# Patient Record
Sex: Female | Born: 1970 | Race: White | Hispanic: No | Marital: Married | Smoking: Never smoker
Health system: Southern US, Community
[De-identification: ages and names within clinical notes are randomized; demographics above are authoritative.]

## PROBLEM LIST (undated history)

## (undated) DIAGNOSIS — M1990 Unspecified osteoarthritis, unspecified site: Secondary | ICD-10-CM

## (undated) DIAGNOSIS — J349 Unspecified disorder of nose and nasal sinuses: Secondary | ICD-10-CM

## (undated) DIAGNOSIS — K219 Gastro-esophageal reflux disease without esophagitis: Secondary | ICD-10-CM

## (undated) DIAGNOSIS — J45909 Unspecified asthma, uncomplicated: Secondary | ICD-10-CM

## (undated) HISTORY — DX: Unspecified disorder of nose and nasal sinuses: J34.9

## (undated) HISTORY — PX: TENDON REPAIR: SHX5111

## (undated) HISTORY — PX: ABDOMINAL HYSTERECTOMY: SHX81

---

## 2009-09-28 ENCOUNTER — Ambulatory Visit: Payer: Self-pay | Admitting: Cardiology

## 2010-11-04 ENCOUNTER — Other Ambulatory Visit: Payer: Self-pay | Admitting: Gastroenterology

## 2010-11-04 DIAGNOSIS — R7989 Other specified abnormal findings of blood chemistry: Secondary | ICD-10-CM

## 2010-11-11 ENCOUNTER — Other Ambulatory Visit: Payer: Self-pay

## 2010-11-17 ENCOUNTER — Ambulatory Visit
Admission: RE | Admit: 2010-11-17 | Discharge: 2010-11-17 | Disposition: A | Discharge status: Home / Self Care | Payer: PRIVATE HEALTH INSURANCE | Source: Ambulatory Visit | Attending: Gastroenterology | Admitting: Gastroenterology

## 2010-11-17 DIAGNOSIS — R7989 Other specified abnormal findings of blood chemistry: Secondary | ICD-10-CM

## 2012-01-21 HISTORY — PX: ABDOMINAL HYSTERECTOMY: SHX81

## 2013-02-21 ENCOUNTER — Ambulatory Visit: Payer: PRIVATE HEALTH INSURANCE | Admitting: Gastroenterology

## 2013-04-15 ENCOUNTER — Encounter: Payer: Self-pay | Admitting: *Deleted

## 2013-04-18 ENCOUNTER — Telehealth: Payer: Self-pay | Admitting: Gastroenterology

## 2013-04-18 ENCOUNTER — Ambulatory Visit: Payer: PRIVATE HEALTH INSURANCE | Admitting: Gastroenterology

## 2013-04-18 NOTE — Telephone Encounter (Signed)
Pt was a no show

## 2015-12-28 ENCOUNTER — Emergency Department (HOSPITAL_COMMUNITY)
Admission: EM | Admit: 2015-12-28 | Discharge: 2015-12-28 | Disposition: A | Discharge status: Home / Self Care | Payer: 59 | Attending: Emergency Medicine | Admitting: Emergency Medicine

## 2015-12-28 ENCOUNTER — Encounter (HOSPITAL_COMMUNITY): Payer: Self-pay | Admitting: Emergency Medicine

## 2015-12-28 ENCOUNTER — Emergency Department (HOSPITAL_COMMUNITY): Payer: 59

## 2015-12-28 DIAGNOSIS — Z791 Long term (current) use of non-steroidal anti-inflammatories (NSAID): Secondary | ICD-10-CM | POA: Diagnosis not present

## 2015-12-28 DIAGNOSIS — R1013 Epigastric pain: Secondary | ICD-10-CM | POA: Diagnosis not present

## 2015-12-28 DIAGNOSIS — Z79899 Other long term (current) drug therapy: Secondary | ICD-10-CM | POA: Diagnosis not present

## 2015-12-28 DIAGNOSIS — M1990 Unspecified osteoarthritis, unspecified site: Secondary | ICD-10-CM | POA: Insufficient documentation

## 2015-12-28 DIAGNOSIS — K5900 Constipation, unspecified: Secondary | ICD-10-CM | POA: Diagnosis not present

## 2015-12-28 DIAGNOSIS — R079 Chest pain, unspecified: Secondary | ICD-10-CM | POA: Insufficient documentation

## 2015-12-28 DIAGNOSIS — R42 Dizziness and giddiness: Secondary | ICD-10-CM | POA: Insufficient documentation

## 2015-12-28 DIAGNOSIS — R0789 Other chest pain: Secondary | ICD-10-CM

## 2015-12-28 DIAGNOSIS — R0602 Shortness of breath: Secondary | ICD-10-CM | POA: Insufficient documentation

## 2015-12-28 DIAGNOSIS — J45909 Unspecified asthma, uncomplicated: Secondary | ICD-10-CM | POA: Insufficient documentation

## 2015-12-28 DIAGNOSIS — Z7982 Long term (current) use of aspirin: Secondary | ICD-10-CM | POA: Diagnosis not present

## 2015-12-28 HISTORY — DX: Unspecified asthma, uncomplicated: J45.909

## 2015-12-28 HISTORY — DX: Unspecified osteoarthritis, unspecified site: M19.90

## 2015-12-28 HISTORY — DX: Gastro-esophageal reflux disease without esophagitis: K21.9

## 2015-12-28 LAB — CBC
HCT: 39.9 % (ref 36.0–46.0)
Hemoglobin: 13.4 g/dL (ref 12.0–15.0)
MCH: 29.5 pg (ref 26.0–34.0)
MCHC: 33.6 g/dL (ref 30.0–36.0)
MCV: 87.9 fL (ref 78.0–100.0)
Platelets: 341 10*3/uL (ref 150–400)
RBC: 4.54 MIL/uL (ref 3.87–5.11)
RDW: 13.1 % (ref 11.5–15.5)
WBC: 7 10*3/uL (ref 4.0–10.5)

## 2015-12-28 LAB — DIFFERENTIAL
Basophils Absolute: 0 10*3/uL (ref 0.0–0.1)
Basophils Relative: 0 %
EOS ABS: 0.1 10*3/uL (ref 0.0–0.7)
EOS PCT: 1 %
LYMPHS ABS: 1.6 10*3/uL (ref 0.7–4.0)
Lymphocytes Relative: 22 %
MONOS PCT: 8 %
Monocytes Absolute: 0.5 10*3/uL (ref 0.1–1.0)
NEUTROS PCT: 69 %
Neutro Abs: 4.9 10*3/uL (ref 1.7–7.7)

## 2015-12-28 LAB — URINALYSIS, ROUTINE W REFLEX MICROSCOPIC
BILIRUBIN URINE: NEGATIVE
Glucose, UA: NEGATIVE mg/dL
Hgb urine dipstick: NEGATIVE
KETONES UR: NEGATIVE mg/dL
Leukocytes, UA: NEGATIVE
Nitrite: NEGATIVE
PH: 6 (ref 5.0–8.0)
Protein, ur: NEGATIVE mg/dL
Specific Gravity, Urine: 1.015 (ref 1.005–1.030)

## 2015-12-28 LAB — COMPREHENSIVE METABOLIC PANEL
ALBUMIN: 4 g/dL (ref 3.5–5.0)
ALK PHOS: 92 U/L (ref 38–126)
ALT: 29 U/L (ref 14–54)
AST: 19 U/L (ref 15–41)
Anion gap: 7 (ref 5–15)
BILIRUBIN TOTAL: 0.6 mg/dL (ref 0.3–1.2)
BUN: 16 mg/dL (ref 6–20)
CALCIUM: 9.1 mg/dL (ref 8.9–10.3)
CO2: 27 mmol/L (ref 22–32)
Chloride: 105 mmol/L (ref 101–111)
Creatinine, Ser: 0.62 mg/dL (ref 0.44–1.00)
GFR calc Af Amer: >60 mL/min (ref >60)
GFR calc non Af Amer: >60 mL/min (ref >60)
GLUCOSE: 111 mg/dL — AB (ref 65–99)
Potassium: 3.9 mmol/L (ref 3.5–5.1)
SODIUM: 139 mmol/L (ref 135–145)
TOTAL PROTEIN: 7 g/dL (ref 6.5–8.1)

## 2015-12-28 LAB — LIPASE, BLOOD: Lipase: 38 U/L (ref 11–51)

## 2015-12-28 LAB — BRAIN NATRIURETIC PEPTIDE: B Natriuretic Peptide: 17 pg/mL (ref 0.0–100.0)

## 2015-12-28 LAB — TROPONIN I: Troponin I: <0.03 ng/mL (ref <0.031)

## 2015-12-28 MED ORDER — POLYETHYLENE GLYCOL 3350 17 GM/SCOOP PO POWD: 17.0000 g | Freq: Once | ORAL | Status: DC

## 2015-12-28 NOTE — Discharge Instructions (Signed)
Your liver function testing is normal and your blood work and urine look normal. Your chest ray is clear but your abdominal x-ray suggests constipation. We recommend taking miralax. Your heart testing is normal as well, and your symptoms does not seem consistent with that of a heart attack  Return without fail if your symptoms are worsening. That includes worsening pain, fevers, vomiting unable to keep down food or fluids, chest pain that comes on with activity, or any other symptoms concerning to you. Please follow up closely with your primary care doctor to discuss need for further testing.   Abdominal Pain, Adult Many things can cause abdominal pain. Usually, abdominal pain is not caused by a disease and will improve without treatment. It can often be observed and treated at home. Your health care provider will do a physical exam and possibly order blood tests and X-rays to help determine the seriousness of your pain. However, in many cases, more time must pass before a clear cause of the pain can be found. Before that point, your health care provider may not know if you need more testing or further treatment. HOME CARE INSTRUCTIONS Monitor your abdominal pain for any changes. The following actions may help to alleviate any discomfort you are experiencing:  Only take over-the-counter or prescription medicines as directed by your health care provider.  Do not take laxatives unless directed to do so by your health care provider.  Try a clear liquid diet (broth, tea, or water) as directed by your health care provider. Slowly move to a bland diet as tolerated. SEEK MEDICAL CARE IF:  You have unexplained abdominal pain.  You have abdominal pain associated with nausea or diarrhea.  You have pain when you urinate or have a bowel movement.  You experience abdominal pain that wakes you in the night.  You have abdominal pain that is worsened or improved by eating food.  You have abdominal pain  that is worsened with eating fatty foods.  You have a fever. SEEK IMMEDIATE MEDICAL CARE IF:  Your pain does not go away within 2 hours.  You keep throwing up (vomiting).  Your pain is felt only in portions of the abdomen, such as the right side or the left lower portion of the abdomen.  You pass bloody or black tarry stools. MAKE SURE YOU:  Understand these instructions.  Will watch your condition.  Will get help right away if you are not doing well or get worse.   This information is not intended to replace advice given to you by your health care provider. Make sure you discuss any questions you have with your health care provider.   Document Released: 05/18/2005 Document Revised: 04/29/2015 Document Reviewed: 04/17/2013 Elsevier Interactive Patient Education [employer]2016 Elsevier Inc.

## 2015-12-28 NOTE — ED Notes (Signed)
Patient given discharge instruction, verbalized understand. Patient ambulatory out of the department.  

## 2015-12-28 NOTE — ED Notes (Signed)
PT c/o middle abdominal pain with tan colored stools with last BM today. PT also states pain goes into her back and aching down left arm as well. PT denies any SOB nausea, vomiting or diarrhea. PT denies any urinary symptoms.

## 2015-12-28 NOTE — ED Notes (Signed)
Pt voices numerous complaints including chest pain a few days ago. Pt states she wants an EKG because the chest pain scared her

## 2015-12-28 NOTE — ED Provider Notes (Signed)
CSN: [id number]     Arrival date & time 12/28/15  1013 History  By signing my name below, I, Ruth Colon, attest that this documentation has been prepared under the direction and in the presence of [name], MD Electronically Signed: [name], ED Scribe 12/28/2015 at 12:53 PM.   Chief Complaint  Patient presents with  . Abdominal Pain   The history is provided by the patient. No language interpreter was used.   HPI Comments: Ruth Colon is a 45 y.o. female, with a h/o GERD, who presents to the Emergency Department complaining of gradually worsening, 4/10 generalized abdominal pain, intermittent, for the past 4 days. Pt currently reports constant low abdominal pain that radiates into her low back and improves after BMs. She has noticed tan colored stools for the past few days. Pt reports associated symptoms of dull, burning left arm pain onset 3 nights ago, dizziness, nausea, constipation, non-exertional chest pain x 4 days. Has been feeling some sob with activity, but this is typical for that of her asthma. She denies dietary changes. Pt also denies fever, chills, night sweats, appetite change, difficulty urinating, urinary malodor or discoloration, vomiting, neck pain, new leg pain or leg swelling. Pshx includes hysterectomy without oophorectomy.   Past Medical History  Diagnosis Date  . GERD (gastroesophageal reflux disease)   . Arthritis   . Asthma    Past Surgical History  Procedure Laterality Date  . Abdominal hysterectomy    . Tendon repair     History reviewed. No pertinent family history. Social History  Substance Use Topics  . Smoking status: Never Smoker   . Smokeless tobacco: None  . Alcohol Use: No   OB History    Gravida Para Term Preterm AB TAB SAB Ectopic Multiple Living   3              Review of Systems  Constitutional: Negative for fever, chills, diaphoresis and appetite change.  Respiratory: Positive for shortness of breath.   Cardiovascular:  Positive for chest pain. Negative for leg swelling.  Gastrointestinal: Positive for nausea, abdominal pain and constipation. Negative for vomiting.  Genitourinary: Negative for difficulty urinating.  Musculoskeletal: Negative for neck pain.  Neurological: Positive for dizziness.  All other systems reviewed and are negative.  Allergies  Macrobid  Home Medications   Prior to Admission medications   Medication Sig Start Date End Date Taking? Authorizing Provider  aspirin EC 81 MG tablet Take 81 mg by mouth daily.   Yes Historical Provider, MD  beclomethasone (QVAR) 40 MCG/ACT inhaler Inhale 1 puff into the lungs daily.   Yes Historical Provider, MD  diclofenac (VOLTAREN) 50 MG EC tablet Take 50 mg by mouth 2 (two) times daily as needed for mild pain.   Yes Historical Provider, MD  ibuprofen (ADVIL,MOTRIN) 800 MG tablet Take 800 mg by mouth every 8 (eight) hours as needed for moderate pain.   Yes Historical Provider, MD  pantoprazole (PROTONIX) 40 MG tablet Take 40 mg by mouth daily as needed (acid reflux).   Yes Historical Provider, MD  PROAIR HFA 108 [phone] Base) MCG/ACT inhaler Inhale 2 puffs into the lungs every 6 (six) hours as needed for wheezing or shortness of breath.  12/27/15  Yes Historical Provider, MD  polyethylene glycol powder (GLYCOLAX/MIRALAX) powder Take 17 g by mouth once. Dissolve powder into liquid and take once daily. 12/28/15   [name], MD   BP 129/77 mmHg  Pulse 70  Temp(Src) 98.1 F (36.7 C) (  Oral)  Resp 18  Ht 5\' 3"  (1.6 m)  Wt 211 lb (95.709 kg)  BMI 37.39 kg/m2  SpO2 100% Physical Exam Physical Exam  Nursing note and vitals reviewed. Constitutional: Well developed, well nourished, non-toxic, and in no acute distress Head: Normocephalic and atraumatic.  Mouth/Throat: Oropharynx is clear and moist.  Neck: Normal range of motion. Neck supple.  Cardiovascular: Normal rate and regular rhythm.   Pulmonary/Chest: Effort normal and breath sounds normal.   Abdominal: Soft. There is mild epigastric tenderness. There is no rebound and no guarding.  Musculoskeletal: Normal range of motion.  Neurological: Alert, no facial droop, fluent speech, moves all extremities symmetrically Skin: Skin is warm and dry.  Psychiatric: Cooperative  ED Course  Procedures (including critical care time) DIAGNOSTIC STUDIES: Oxygen Saturation is 100% on RA, normal by my interpretation.    COORDINATION OF CARE: 11:47 AM-Discussed treatment plan which includes XR, EKG, CBC, CMP, UA, I-stat, with pt at bedside and pt agreed to plan.   Labs Review Labs Reviewed  COMPREHENSIVE METABOLIC PANEL - Abnormal; Notable for the following:    Glucose, Bld 111 (*)    All other components within normal limits  LIPASE, BLOOD  CBC  URINALYSIS, ROUTINE W REFLEX MICROSCOPIC (NOT AT [facility])  BRAIN NATRIURETIC PEPTIDE  DIFFERENTIAL  TROPONIN I   Imaging Review Dg Abd Acute W/chest  12/28/2015  CLINICAL DATA:  Abdominal and chest pain EXAM: DG ABDOMEN ACUTE W/ 1V CHEST COMPARISON:  CT abdomen and pelvis February 16, 2015; chest radiograph May 06, 2008 FINDINGS: Chest: Lungs are clear. Heart size and pulmonary vascularity are normal. No adenopathy. Supine and upright abdomen: There is moderate stool throughout colon. There is no bowel dilatation or air-fluid level suggesting obstruction. No free air. There are small phleboliths in the pelvis. IMPRESSION: Moderate stool throughout colon. No bowel obstruction or free air. Lungs clear. Electronically Signed   By: [name]  [name] M.D.   On: 12/28/2015 12:36   I have personally reviewed and evaluated these images and lab results as part of my medical decision-making.   EKG Interpretation   Date/Time:  Monday Dec 28 2015 11:28:05 EDT Ventricular Rate:  69 PR Interval:  143 QRS Duration: 104 QT Interval:  403 QTC Calculation: 432 R Axis:   45 Text Interpretation:  Sinus rhythm Low voltage, precordial leads Baseline  wander  in lead(s) I aVL No acute ischemic changes. No prior ekg  Confirmed  by Isacc Turney MD, Winfred Iiams [phone]) on 12/28/2015 12:34:51 PM      MDM   Final diagnoses:  Epigastric pain  Burning chest pain    With intermittent abdominal pain and burning chest pain over past 3-4 days. Vitals WNL in ED and she is well appearing and in no acute distress.She has a soft and benign abdomen. Burning epigastric pain. Cardiopulmonary exam unremarkable. Unclear etiology of tan colored stool as LFTs are normal and not a hepatobiliary etiology of symptoms. Remainder of basic blood work unremarkable, including CBC, lipase, UA. XR abd suggests moderate stool load. Given benign abdomen, at this time, i do not suspect serious intraabdominal process and do not think she requires further imaging. Will start her on bowel regimen and have her f/u closely with her PCP.   In regards to her chest pain, seems more related to her GI issues. Not exertional related and unlikely ACS. EKG without ischemia. Troponin negative. CXR without acute cardiopulmonary processes. Low risk for ACS and history/exam not c/f dissection, PE or other serious causes.  She will continue supportive care with close outpatient f/u. Strict return and follow-up instructions reviewed. She expressed understanding of all discharge instructions and felt comfortable with the plan of care.   I personally performed the services described in this documentation, which was scribed in my presence. The recorded information has been reviewed and is accurate.    [name], MD 12/28/15 [phone]

## 2015-12-28 NOTE — ED Notes (Signed)
Pt ambulatory to the bathroom 

## 2017-08-23 IMAGING — DX DG ABDOMEN ACUTE W/ 1V CHEST
4 series · 4 of 4 positions shown · non-contrast
Comparison: CT abdomen and pelvis February 16, 2015; chest radiograph
May 06, 2008

CLINICAL DATA: Abdominal and chest pain

EXAM:
DG ABDOMEN ACUTE W/ 1V CHEST

[chest pa]
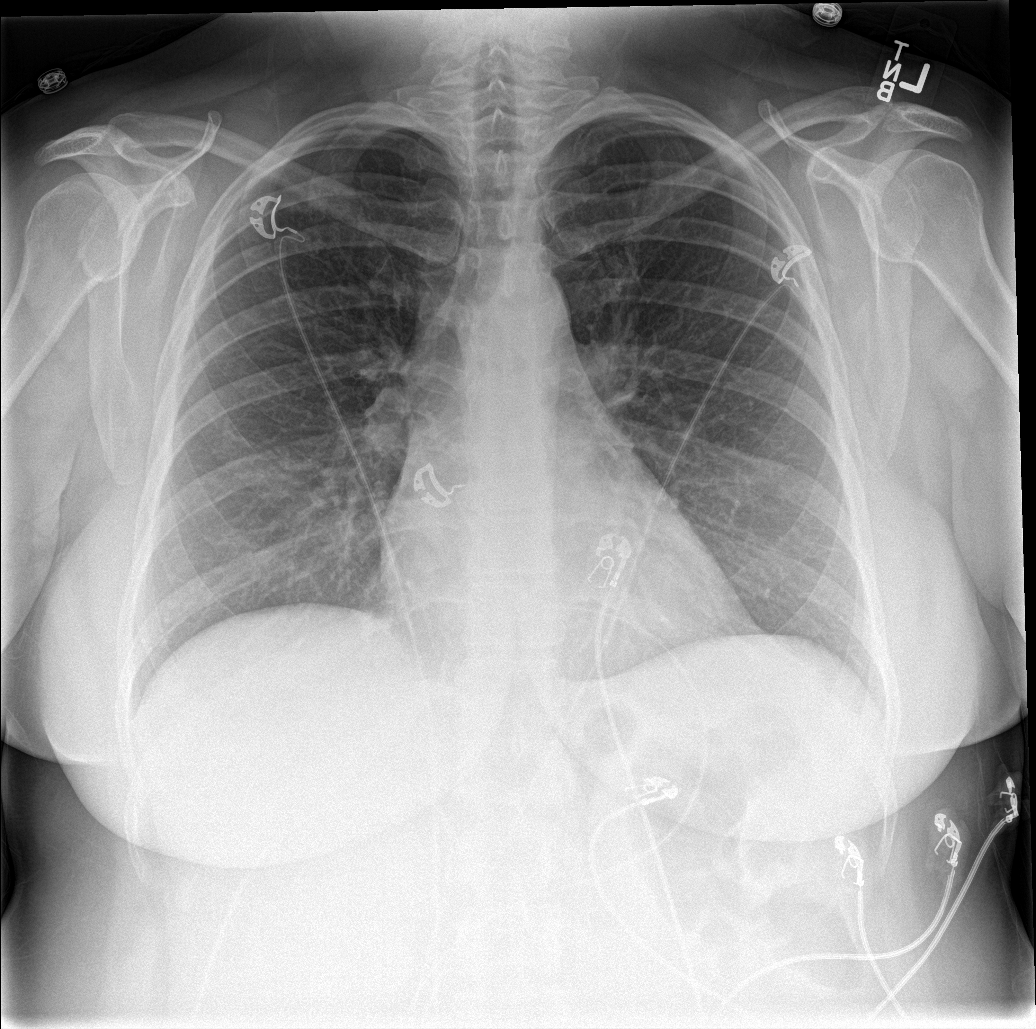

[abdomen erect]
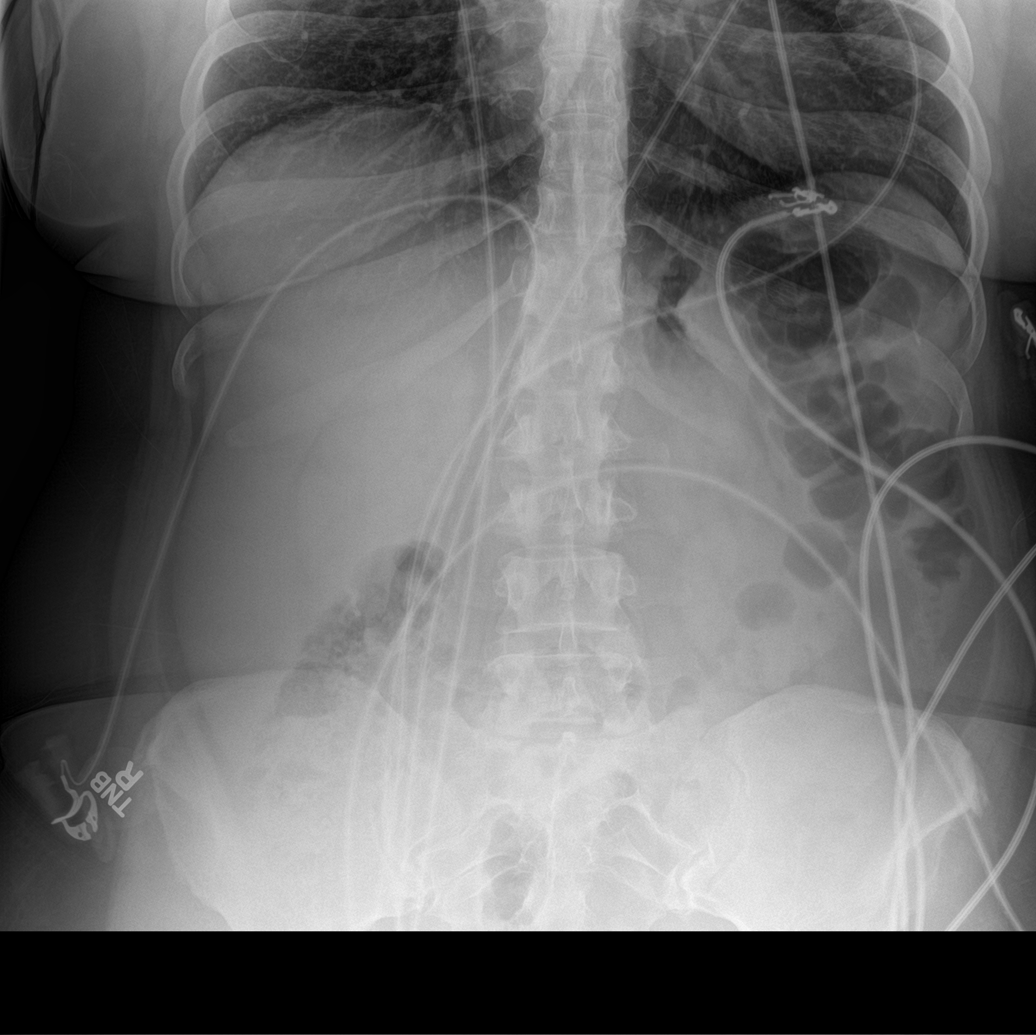

[abdomen supine (1 of 2)]
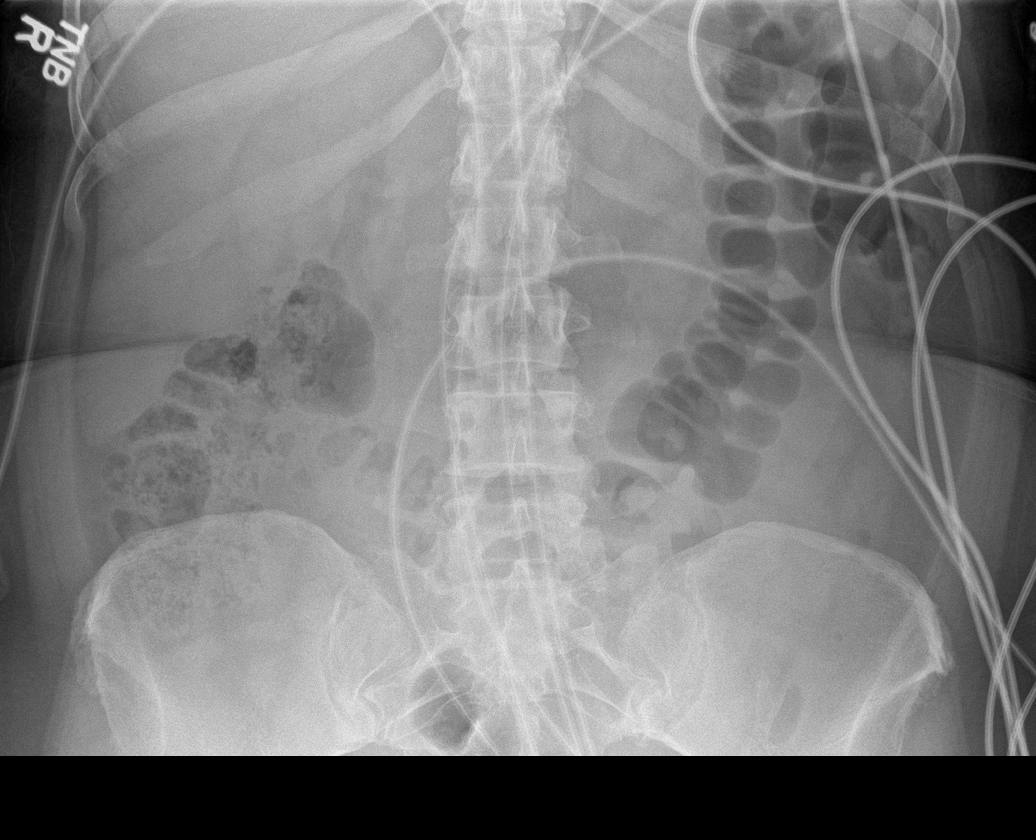

[abdomen supine (2 of 2)]
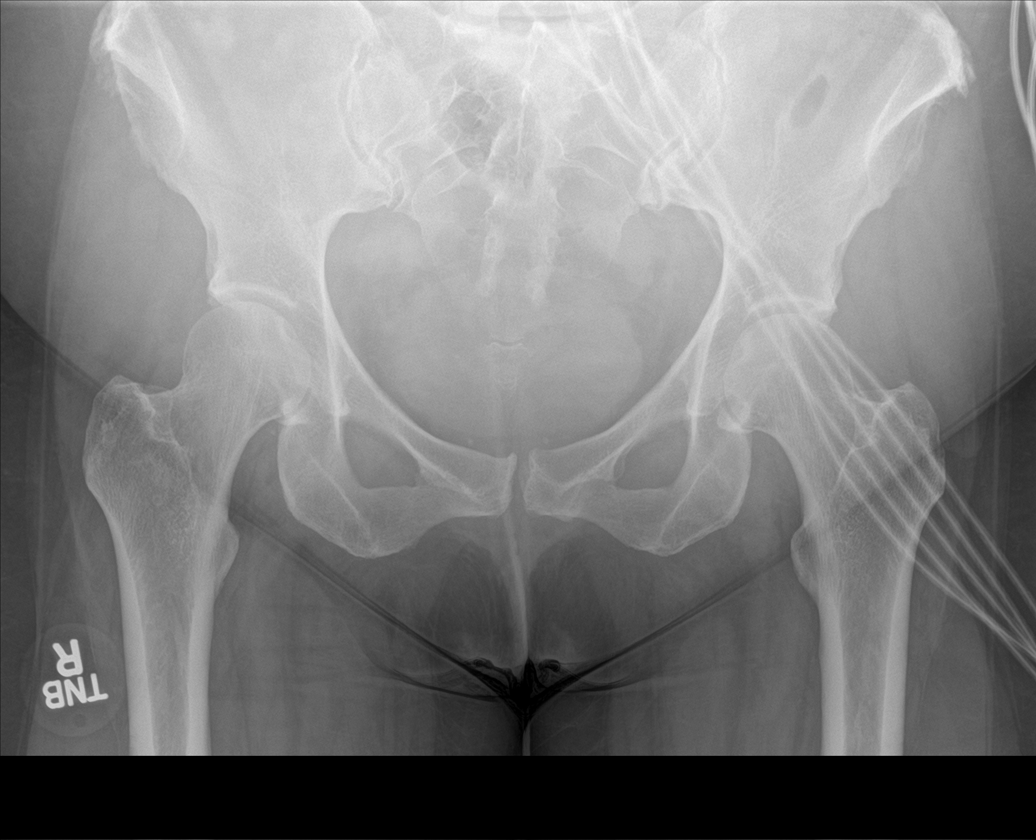

[4 of 4 positions shown; findings below may reference images not displayed]

FINDINGS: Chest: Lungs are clear. Heart size and pulmonary vascularity are
normal. No adenopathy.

Supine and upright abdomen: There is moderate stool throughout
colon. There is no bowel dilatation or air-fluid level suggesting
obstruction. No free air. There are small phleboliths in the pelvis.
IMPRESSION: Moderate stool throughout colon. No bowel obstruction or free air.
Lungs clear.

## 2019-08-27 ENCOUNTER — Telehealth: Payer: Self-pay | Admitting: Pulmonary Disease

## 2019-08-27 NOTE — Telephone Encounter (Signed)
I called and spoke with the patient and went over Covid questions and she did not have any. She states that her chest xray and labs had recently have come back clear and she will bring a copy with her. She's over the 21 day mark and denies any symptoms. I advised her that she will be ok to come in for her Consult and verified the address, mask policy, and visitor policy. Nothing further is needed at this time.

## 2019-08-28 ENCOUNTER — Other Ambulatory Visit: Payer: Self-pay

## 2019-08-28 ENCOUNTER — Encounter: Payer: Self-pay | Admitting: Pulmonary Disease

## 2019-08-28 ENCOUNTER — Ambulatory Visit (INDEPENDENT_AMBULATORY_CARE_PROVIDER_SITE_OTHER): Payer: Commercial Managed Care - PPO | Admitting: Pulmonary Disease

## 2019-08-28 VITALS — BP 126/78 | HR 81 | Temp 98.2°F | Ht 63.0 in | Wt 236.4 lb

## 2019-08-28 DIAGNOSIS — J301 Allergic rhinitis due to pollen: Secondary | ICD-10-CM

## 2019-08-28 DIAGNOSIS — K219 Gastro-esophageal reflux disease without esophagitis: Secondary | ICD-10-CM

## 2019-08-28 DIAGNOSIS — J309 Allergic rhinitis, unspecified: Secondary | ICD-10-CM | POA: Insufficient documentation

## 2019-08-28 DIAGNOSIS — J4541 Moderate persistent asthma with (acute) exacerbation: Secondary | ICD-10-CM | POA: Diagnosis not present

## 2019-08-28 MED ORDER — FLUTICASONE PROPIONATE 50 MCG/ACT NA SUSP: 1.0000 | Freq: Every day | NASAL | 5 refills | Status: DC

## 2019-08-28 MED ORDER — PREDNISONE 10 MG PO TABS: ORAL_TABLET | ORAL | 0 refills | Status: DC

## 2019-08-28 MED ORDER — MONTELUKAST SODIUM 10 MG PO TABS: 10.0000 mg | ORAL_TABLET | Freq: Every day | ORAL | 11 refills | Status: DC

## 2019-08-28 NOTE — Patient Instructions (Addendum)
Moderate Persistent Allergic Asthma COMPLETE steroid taper as directed CONTINUE Symbicort 160-4.5 TWO puffs TWICE a day START singulair 10 mg ONCE a day We will arrange pulmonary function tests to evaluate your asthma severity  Allergic Rhinitis CONTINUE Flonase 1 spray per nare CONTINUE claritin 10 mg ONCE a day  Asthma Action Plan Increase Symbicort to 2 puffs THREE times a day for worsening shortness of breath, wheezing and cough. If you symptoms do not improve in 24-48 hours, please our office for evaluation and/or prednisone taper.   Food Choices for Gastroesophageal Reflux Disease, Adult When you have gastroesophageal reflux disease (GERD), the foods you eat and your eating habits are very important. Choosing the right foods can help ease your discomfort. Think about working with a nutrition specialist (dietitian) to help you make good choices. What are tips for following this plan?  Meals  Choose healthy foods that are low in fat, such as fruits, vegetables, whole grains, low-fat dairy products, and lean meat, fish, and poultry.  Eat small meals often instead of 3 large meals a day. Eat your meals slowly, and in a place where you are relaxed. Avoid bending over or lying down until 2-3 hours after eating.  Avoid eating meals 2-3 hours before bed.  Avoid drinking a lot of liquid with meals.  Cook foods using methods other than frying. Bake, grill, or broil food instead.  Avoid or limit: ? Chocolate. ? Peppermint or spearmint. ? Alcohol. ? Pepper. ? Black and decaffeinated coffee. ? Black and decaffeinated tea. ? Bubbly (carbonated) soft drinks. ? Caffeinated energy drinks and soft drinks.  Limit high-fat foods such as: ? Fatty meat or fried foods. ? Whole milk, cream, butter, or ice cream. ? Nuts and nut butters. ? Pastries, donuts, and sweets made with butter or shortening.  Avoid foods that cause symptoms. These foods may be different for everyone. Common foods  that cause symptoms include: ? Tomatoes. ? Oranges, lemons, and limes. ? Peppers. ? Spicy food. ? Onions and garlic. ? Vinegar. Lifestyle  Maintain a healthy weight. Ask your doctor what weight is healthy for you. If you need to lose weight, work with your doctor to do so safely.  Exercise for at least 30 minutes for 5 or more days each week, or as told by your doctor.  Wear loose-fitting clothes.  Do not smoke. If you need help quitting, ask your doctor.  Sleep with the head of your bed higher than your feet. Use a wedge under the mattress or blocks under the bed frame to raise the head of the bed. Summary  When you have gastroesophageal reflux disease (GERD), food and lifestyle choices are very important in easing your symptoms.  Eat small meals often instead of 3 large meals a day. Eat your meals slowly, and in a place where you are relaxed.  Limit high-fat foods such as fatty meat or fried foods.  Avoid bending over or lying down until 2-3 hours after eating.  Avoid peppermint and spearmint, caffeine, alcohol, and chocolate. This information is not intended to replace advice given to you by your health care provider. Make sure you discuss any questions you have with your health care provider. Document Revised: 11/29/2018 Document Reviewed: 09/13/2016 Elsevier Patient Education  2020 [employer].

## 2019-08-28 NOTE — Progress Notes (Signed)
Subjective:   PATIENT ID: Ruth Colon GENDER: female DOB: [birth year], MRN: [id number]   HPI  Chief Complaint  Patient presents with  . Pulmonary Consult    Self referral for Asthma. Dx with covid on 07/30/2019 and feels as if it affected her asthma worse. Nonproductive cough. Pt denies any fever or chills.    Reason for Visit: Self-referral for asthma  Ms. Ruth Colon is a 49 year old female never smoker with childhood history of asthma, allergic rhinitis and reflux who presents as a new patient for management of asthma.  She was diagnosed with asthma during childhood and had a re-emergence of symptoms when she was 39 that manifested as shortness of breath, coughing, wheezing and chest tightness. She was started on Qvar with only partial relief. Winter and spring are her worst times and usually uses Qvar 40 mcg 2 puff once a day. This season she was taking albuterol four times a day for the last 6 week. She has allergic symptoms and takes flonase and claritin. No ED visits or hospitalizations.  She reports dry unproductive cough and shortness of breath in the last month that worsens wit exertion, heart and cold air. Improves with drinking cold drinks. Also worsened by reflux. She was started on Symbicort last week 160-4.5 mcg 2 puffs twice a day and has been able to cut back on Albuterol twice a day.   She was diagnosed COVID-19 on 07/30/19 with mild symptoms with chills, persistent non-productive cough, chest tightness and wheezing. She received steroids x 2 during this time. Otherwise, she does not typically require steroids for her asthma. This self-resolved and she feels asthma is her primary issue.  2020 Jan Feb March April May June July Aug Sept Oct Nov Dec              XX  202 Jan Feb March April May June July Aug Sept Oct Nov Dec                  Social History: Never smoker Works at an [employer] at [employer] exposures:  Cat at home  I have personally  reviewed patient's past medical/family/social history, allergies, current medications.  Past Medical History:  Diagnosis Date  . Arthritis   . Asthma   . GERD (gastroesophageal reflux disease)   . Sinus problem      Family History  Problem Relation Age of Onset  . Allergies Mother   . Asthma Mother   . Heart disease Mother   . Rheum arthritis Mother   . Hypertension Father   . Diabetes Father      Social History   Occupational History  . Not on file  Tobacco Use  . Smoking status: Never Smoker  . Smokeless tobacco: Never Used  Substance and Sexual Activity  . Alcohol use: No  . Drug use: No  . Sexual activity: Not on file    Allergies  Allergen Reactions  . Morphine     Other reaction(s): Decreased Blood Pressure  . Cephalexin Nausea Only and Rash  . Ipratropium     Self-reported tingling and mild swelling with difficulty talking  . Nitrofurantoin Macrocrystal Rash  . Latex Rash  . Macrobid [Nitrofurantoin] Rash     Outpatient Medications Prior to Visit  Medication Sig Dispense Refill  . aspirin EC 81 MG tablet Take 81 mg by mouth daily.    . budesonide-formoterol (SYMBICORT) 160-4.5 MCG/ACT inhaler Inhale 2 puffs into the lungs 2 (two) times daily.    Marland Kitchen  famotidine (PEPCID) 20 MG tablet Take 20 mg by mouth 2 (two) times daily.    Marland Kitchen omeprazole (PRILOSEC) 20 MG capsule Take 20 mg by mouth daily.    Marland Kitchen PROAIR HFA 108 (90 Base) MCG/ACT inhaler Inhale 2 puffs into the lungs every 6 (six) hours as needed for wheezing or shortness of breath.     . diclofenac (VOLTAREN) 50 MG EC tablet Take 50 mg by mouth 2 (two) times daily as needed for mild pain.    Marland Kitchen ibuprofen (ADVIL,MOTRIN) 800 MG tablet Take 800 mg by mouth every 8 (eight) hours as needed for moderate pain.    . beclomethasone (QVAR) 40 MCG/ACT inhaler Inhale 1 puff into the lungs daily.    . pantoprazole (PROTONIX) 40 MG tablet Take 40 mg by mouth daily as needed (acid reflux).    . polyethylene glycol powder  (GLYCOLAX/MIRALAX) powder Take 17 g by mouth once. Dissolve powder into liquid and take once daily. (Patient not taking: Reported on 08/28/2019) 500 g 0   No facility-administered medications prior to visit.    Review of Systems  Constitutional: Negative for chills, diaphoresis, fever, malaise/fatigue and weight loss.  HENT: Negative for congestion, ear pain and sore throat.   Respiratory: Positive for cough and shortness of breath. Negative for hemoptysis, sputum production and wheezing.   Cardiovascular: Negative for chest pain, palpitations and leg swelling.  Gastrointestinal: Positive for heartburn. Negative for abdominal pain and nausea.  Genitourinary: Negative for frequency.  Musculoskeletal: Negative for joint pain and myalgias.  Skin: Negative for itching and rash.  Neurological: Negative for dizziness, weakness and headaches.  Endo/Heme/Allergies: Does not bruise/bleed easily.  Psychiatric/Behavioral: Negative for depression. The patient is not nervous/anxious.      Objective:   Vitals:   08/28/19 0905  BP: 126/78  Pulse: 81  Temp: 98.2 F (36.8 C)  TempSrc: Temporal  SpO2: 97%  Weight: 236 lb 6.4 oz (107.2 kg)  Height: 5\' 3"  (1.6 m)   SpO2: 97 %(on ra) O2 Device: None (Room air)  Physical Exam: General: Well-appearing, no acute distress HENT: [place], AT, OP clear, MMM Eyes: EOMI, no scleral icterus Respiratory: Clear to auscultation bilaterally.  No crackles, wheezing or rales Cardiovascular: RRR, -M/R/G, no JVD GI: BS+, soft, nontender Extremities:-Edema,-tenderness Neuro: AAO x4, CNII-XII grossly intact Skin: Intact, no rashes or bruising Psych: Normal mood, normal affect  Data Reviewed:  Imaging: 12/28/2015 CXR/abdomen-normal chest with no infiltrate, effusion or edema.  Normal cardiac silhouette.  Abdominal x-ray unremarkable  08/21/19 CXR (report only) Lungs are clear. Heart size and pulmonary vascularity are normal. No adenopathy. No bone lesions.  Radiology impression: No abnormality noted  PFT: None on file  Labs: [facility] 08/21/19 WBC 7.6> Eos 3.8% (300 absolute) Hg 12.6 Plt 322      Assessment & Plan:   Discussion: 49 year old female never smoker with childhood history of asthma, allergic rhinitis and reflux who presents as a new consult for asthma. Symptoms are uncontrolled. Does have evidence of eosinophilia so she would be a candidate for biologics if she remains uncontrolled on bronchodilators. Will treat for active exacerbation today.  Moderate Persistent Allergic Asthma with active exacerbation COMPLETE steroid taper as directed CONTINUE Symbicort 160-4.5 TWO puffs TWICE a day START singulair 10 mg ONCE a day We will arrange pulmonary function tests to evaluate your asthma severity  Allergic Rhinitis CONTINUE Flonase 1 spray per nare CONTINUE claritin 10 mg ONCE a day  Asthma Action Plan Increase Symbicort to 2 puffs THREE  times a day for worsening shortness of breath, wheezing and cough. If you symptoms do not improve in 24-48 hours, please our office for evaluation and/or prednisone taper. Health Maintenance Immunization History  Administered Date(s) Administered  . Influenza,inj,Quad PF,6+ Mos 06/11/2019   CT Lung Screen - not qualified  Orders Placed This Encounter  Procedures  . Pulmonary function test    Standing Status:   Future    Standing Expiration Date:   08/27/2020    Order Specific Question:   Where should this test be performed?    Answer:   [facility]    Order Specific Question:   Full PFT: includes the following: basic spirometry, spirometry pre & post bronchodilator, diffusion capacity (DLCO), lung volumes    Answer:   Full PFT   Meds ordered this encounter  Medications  . montelukast (SINGULAIR) 10 MG tablet    Sig: Take 1 tablet (10 mg total) by mouth at bedtime.    Dispense:  30 tablet    Refill:  11  . fluticasone (FLONASE) 50 MCG/ACT nasal spray    Sig: Place 1  spray into both nostrils daily.    Dispense:  16 g    Refill:  5  . predniSONE (DELTASONE) 10 MG tablet    Sig: Take 4 tabs for 3 days, then take 3 tabs for 3 days, then take 2 tabs for 3 days, then stop    Dispense:  27 tablet    Refill:  0    Return in about 3 months (around 11/26/2019).  I have spent a total time of 46- minutes on the day of the appointment reviewing prior documentation, coordinating care and discussing medical diagnosis and plan with the patient/family. Imaging, labs and tests included in this note have been reviewed and interpreted independently by me.  Kyaira Trantham Mechele Collin, MD [facility] 08/28/2019 1:14 PM  Office Number [phone]

## 2019-09-04 ENCOUNTER — Telehealth: Payer: Self-pay | Admitting: Pulmonary Disease

## 2019-09-04 NOTE — Telephone Encounter (Signed)
lmtcb for pt.  

## 2019-09-05 NOTE — Telephone Encounter (Signed)
Spoke with Ruth Colon. She is aware of Brian's response. Ruth Colon states that her temp is 98.6 as of this morning. She has been scheduled to see Arlys John on 09/06/19 at 0930 per her request. Advised Ruth Colon that if she develops a temp between now and then she would need to call back and have her appointment changed. She verbalized understanding. Nothing further was needed.

## 2019-09-05 NOTE — Telephone Encounter (Signed)
Spoke with the pt  She states her cough, wheezing and SOB are not improving since the last visit here on 08/28/19  She states this has been going on for about 6 wks now  Cough is prod with white to clear sputum  She is not having any fevers, chills, body aches No recent travel or sick contacts  She wants to be seen for further eval- she is taking her symbciort as directed and finished steroid taper. Taking singluair, flonase and claritin and albuterol without relief.   Please advise if okay to bring to office thanks

## 2019-09-05 NOTE — Telephone Encounter (Signed)
Given patient's length of symptoms as well as recent evaluation in our office and no recent travel or sick contacts.  Patient denies fevers, chills, body aches.  No worsened symptoms since last evaluation.  Likely directly related to her asthma which we are following her for.    Is the patient able to check her temperature so we have a recorded temperature in the note.  If afebrile:  She can be scheduled for a follow-up visit in office.  When scheduling the patient for follow-up visit please route this message to the provider that you are scheduling with that way they are aware.    As always the provider has the option to wear PPE for additional protection during the ongoing Covid pandemic.Elisha Headland, FNP

## 2019-09-05 NOTE — Progress Notes (Signed)
@Patient  ID: Ruth Colon, female    DOB: [birth year], 49 y.o.   MRN: [id number]  Chief Complaint  Patient presents with  . Follow-up    Possible asthma flare up for the past week. Productive cough with clear mucus, wheezing, chest tightness.     Referring provider: Rory Percy, MD  HPI:  49 year old female never smoker followed in our office for suspected asthma  PMH: Allergic rhinitis, Sjogren's syndrome, GERD, COVID-19 positive December/2020 Smoker/ Smoking History: Never smoker  maintenance:  Symbicort 160 -3 times daily Pt of: Dr. Loanne Colon  09/06/2019  - Visit   49 year old female never smoker presenting back to our office today with increased shortness of breath.  Patient was established with our team on 08/28/2019 with Dr. Loanne Colon.  Patient is status post Covid infection on 07/30/2019.  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  She has had 3 rounds of prednisone since being infected with Covid.  Prior to Covid infection patient had stable asthma.  She reports that she had been to a provider for follow-up with her asthma for a year and a half.  Since last office visit she is completed a round of prednisone she finished this 2 days ago.  She has increased her Symbicort 160-3 times daily.  She continues to have persistent cough, congestion, wheezing.  She is having to use her rescue inhaler 4 to 5 times daily.   Of note patient does feel that she response to prednisone.  Unfortunately symptoms returned when she is tapered off.  Unfortunately she has not had any pulmonary function testing.  This has been delayed as well due to her Covid diagnosis.  She needs to have 90 days in order to be tested in the [facility] system to complete a pulmonary function test.  ACT score today is 8  Questionaires / Pulmonary Flowsheets:   ACT:  Asthma Control Test ACT Total Score  09/06/2019 8    MMRC: mMRC Dyspnea Scale mMRC Score  09/06/2019 1    Tests:    Imaging: 12/28/2015 CXR/abdomen-normal chest with no infiltrate, effusion or edema.  Normal cardiac silhouette.  Abdominal x-ray unremarkable  08/21/19 CXR (report only) Lungs are clear. Heart size and pulmonary vascularity are normal. No adenopathy. No bone lesions. Radiology impression: No abnormality noted  PFT: None on file  Labs: [facility] 08/21/19 WBC 7.6> Eos 3.8% (300 absolute) Hg 12.6 Plt 322  09/05/2019-eosinophils 1, eosinophils absolute 0.1  07/30/2019-SARS-CoV-2-positive  FENO:  No results found for: NITRICOXIDE  PFT: No flowsheet data found.  WALK:  No flowsheet data found.  Imaging: No results found.  Lab Results:  CBC    Component Value Date/Time   WBC 7.0 12/28/2015 1111   RBC 4.54 12/28/2015 1111   HGB 13.4 12/28/2015 1111   HCT 39.9 12/28/2015 1111   PLT 341 12/28/2015 1111   MCV 87.9 12/28/2015 1111   MCH 29.5 12/28/2015 1111   MCHC 33.6 12/28/2015 1111   RDW 13.1 12/28/2015 1111   LYMPHSABS 1.6 12/28/2015 1111   MONOABS 0.5 12/28/2015 1111   EOSABS 0.1 12/28/2015 1111   BASOSABS 0.0 12/28/2015 1111    BMET    Component Value Date/Time   NA 139 12/28/2015 1111   K 3.9 12/28/2015 1111   CL 105 12/28/2015 1111   CO2 27 12/28/2015 1111   GLUCOSE 111 (H) 12/28/2015 1111   BUN 16 12/28/2015 1111   CREATININE 0.62 12/28/2015 1111   CALCIUM 9.1 12/28/2015 1111  GFRNONAA >60 12/28/2015 1111   GFRAA >60 12/28/2015 1111    BNP    Component Value Date/Time   BNP 17.0 12/28/2015 1111    ProBNP No results found for: PROBNP  Specialty Problems      Pulmonary Problems   Allergic rhinitis   Moderate persistent allergic asthma with acute exacerbation      Allergies  Allergen Reactions  . Morphine     Other reaction(s): Decreased Blood Pressure  . Cephalexin Nausea Only and Rash  . Ipratropium     Self-reported tingling and mild swelling with difficulty talking  . Nitrofurantoin Macrocrystal Rash  . Latex Rash  .  Macrobid [Nitrofurantoin] Rash    Immunization History  Administered Date(s) Administered  . Influenza,inj,Quad PF,6+ Mos 06/11/2019    Past Medical History:  Diagnosis Date  . Arthritis   . Asthma   . GERD (gastroesophageal reflux disease)   . Sinus problem     Tobacco History: Social History   Tobacco Use  Smoking Status Never Smoker  Smokeless Tobacco Never Used   Counseling given: Yes   Continue to not smoke  Outpatient Encounter Medications as of 09/06/2019  Medication Sig  . aspirin EC 81 MG tablet Take 81 mg by mouth daily.  . budesonide-formoterol (SYMBICORT) 160-4.5 MCG/ACT inhaler Inhale 2 puffs into the lungs 2 (two) times daily.  . famotidine (PEPCID) 20 MG tablet Take 20 mg by mouth 2 (two) times daily.  . fluticasone (FLONASE) 50 MCG/ACT nasal spray Place 1 spray into both nostrils daily.  Marland Kitchen ibuprofen (ADVIL,MOTRIN) 800 MG tablet Take 800 mg by mouth every 8 (eight) hours as needed for moderate pain.  . montelukast (SINGULAIR) 10 MG tablet Take 1 tablet (10 mg total) by mouth at bedtime.  Marland Kitchen omeprazole (PRILOSEC) 20 MG capsule Take 20 mg by mouth daily.  Marland Kitchen PROAIR HFA 108 (90 Base) MCG/ACT inhaler Inhale 2 puffs into the lungs every 6 (six) hours as needed for wheezing or shortness of breath.   . predniSONE (DELTASONE) 10 MG tablet Take 4 tablets (40 mg total) by mouth daily with breakfast for 3 days, THEN 3 tablets (30 mg total) daily with breakfast for 3 days, THEN 2 tablets (20 mg total) daily with breakfast for 3 days, THEN 1 tablet (10 mg total) daily with breakfast for 3 days.  . [DISCONTINUED] predniSONE (DELTASONE) 10 MG tablet Take 4 tabs for 3 days, then take 3 tabs for 3 days, then take 2 tabs for 3 days, then stop  . [DISCONTINUED] predniSONE (DELTASONE) 10 MG tablet Take 4 tablets (40 mg total) by mouth daily with breakfast for 3 days, THEN 3 tablets (30 mg total) daily with breakfast for 3 days, THEN 2 tablets (20 mg total) daily with breakfast for 3  days, THEN 1 tablet (10 mg total) daily with breakfast for 3 days.   No facility-administered encounter medications on file as of 09/06/2019.     Review of Systems  Review of Systems  Constitutional: Positive for fatigue. Negative for activity change and fever.  HENT: Negative for sinus pressure, sinus pain and sore throat.   Respiratory: Positive for cough (Productive white sputum), shortness of breath and wheezing.   Cardiovascular: Negative for chest pain, palpitations and leg swelling.  Gastrointestinal: Negative for diarrhea, nausea and vomiting.  Musculoskeletal: Negative for arthralgias.  Neurological: Negative for dizziness.  Psychiatric/Behavioral: Negative for sleep disturbance. The patient is not nervous/anxious.      Physical Exam  BP 120/74 (BP Location: Left Arm,  Patient Position: Sitting, Cuff Size: Large)   Pulse 77   Temp (!) 97.4 F (36.3 C) (Temporal)   Ht 5\' 3"  (1.6 m)   Wt 234 lb (106.1 kg)   SpO2 97% Comment: RA  BMI 41.45 kg/m   Wt Readings from Last 5 Encounters:  09/06/19 234 lb (106.1 kg)  08/28/19 236 lb 6.4 oz (107.2 kg)  12/28/15 211 lb (95.7 kg)    BMI Readings from Last 5 Encounters:  09/06/19 41.45 kg/m  08/28/19 41.88 kg/m  12/28/15 37.38 kg/m     Physical Exam Vitals and nursing note reviewed.  Constitutional:      General: She is not in acute distress.    Appearance: Normal appearance. She is obese.  HENT:     Head: Normocephalic and atraumatic.     Right Ear: Tympanic membrane, ear canal and external ear normal. There is no impacted cerumen.     Left Ear: Tympanic membrane, ear canal and external ear normal. There is no impacted cerumen.     Nose: Rhinorrhea present. No congestion.     Comments: No visible nasal polyps    Mouth/Throat:     Mouth: Mucous membranes are moist.     Pharynx: Oropharynx is clear.     Comments: Postnasal drip Eyes:     Pupils: Pupils are equal, round, and reactive to light.  Cardiovascular:      Rate and Rhythm: Normal rate and regular rhythm.     Pulses: Normal pulses.     Heart sounds: Normal heart sounds. No murmur.  Pulmonary:     Effort: Pulmonary effort is normal. No respiratory distress.     Breath sounds: No decreased air movement. Wheezing present. No decreased breath sounds or rales.  Abdominal:     General: Abdomen is flat. Bowel sounds are normal.     Palpations: Abdomen is soft.     Comments: Obese abdomen  Musculoskeletal:     Cervical back: Normal range of motion.  Skin:    General: Skin is warm and dry.     Capillary Refill: Capillary refill takes less than 2 seconds.  Neurological:     General: No focal deficit present.     Mental Status: She is alert and oriented to person, place, and time. Mental status is at baseline.     Gait: Gait normal.  Psychiatric:        Mood and Affect: Mood normal.        Behavior: Behavior normal.        Thought Content: Thought content normal.        Judgment: Judgment normal.       Assessment & Plan:   Allergic rhinitis Plan: Continue Flonase Start daily antihistamine Continue Singulair Prednisone taper  Moderate persistent allergic asthma with acute exacerbation Status post 3 prednisone tapers Current wheezing, cough, congestion  Plan: Prednisone taper today Continue Symbicort 160 3 times daily We will add Spiriva Respimat 1.25 We will work to coordinate pulmonary function testing to be completed in March/2020 4-week follow-up with Dr. April/2020 signed for Elizbeth Squires  Work note to excuse from work till 09/09/2019  Sjogren's syndrome [facility]) Plan: Continue follow-up with primary care   COVID-19 virus detected Plan: We will obtain pulmonary function testing    Return in about 4 weeks (around 10/04/2019), or if symptoms worsen or fail to improve, for Follow up with Dr. 12/02/2019.   Ruth All, NP 09/06/2019   This appointment required 34 minutes of patient  care (this includes  precharting, chart review, review of results, face-to-face care, etc.).

## 2019-09-06 ENCOUNTER — Other Ambulatory Visit: Payer: Self-pay

## 2019-09-06 ENCOUNTER — Telehealth: Payer: Self-pay | Admitting: Pulmonary Disease

## 2019-09-06 ENCOUNTER — Ambulatory Visit: Payer: Commercial Managed Care - PPO | Admitting: Pulmonary Disease

## 2019-09-06 ENCOUNTER — Encounter: Payer: Self-pay | Admitting: Pulmonary Disease

## 2019-09-06 VITALS — BP 120/74 | HR 77 | Temp 97.4°F | Ht 63.0 in | Wt 234.0 lb

## 2019-09-06 DIAGNOSIS — U071 COVID-19: Secondary | ICD-10-CM

## 2019-09-06 DIAGNOSIS — J4541 Moderate persistent asthma with (acute) exacerbation: Secondary | ICD-10-CM | POA: Diagnosis not present

## 2019-09-06 DIAGNOSIS — M3500 Sicca syndrome, unspecified: Secondary | ICD-10-CM | POA: Diagnosis not present

## 2019-09-06 DIAGNOSIS — J301 Allergic rhinitis due to pollen: Secondary | ICD-10-CM

## 2019-09-06 MED ORDER — PREDNISONE 10 MG PO TABS: ORAL_TABLET | ORAL | 0 refills | Status: AC

## 2019-09-06 MED ORDER — PREDNISONE 10 MG PO TABS: ORAL_TABLET | ORAL | 0 refills | Status: DC

## 2019-09-06 MED ORDER — SPIRIVA RESPIMAT 1.25 MCG/ACT IN AERS: 2.0000 | INHALATION_SPRAY | Freq: Every day | RESPIRATORY_TRACT | 0 refills | Status: DC

## 2019-09-06 NOTE — Assessment & Plan Note (Signed)
Plan: We will obtain pulmonary function testing

## 2019-09-06 NOTE — Assessment & Plan Note (Signed)
Plan: Continue Flonase Start daily antihistamine Continue Singulair Prednisone taper

## 2019-09-06 NOTE — Assessment & Plan Note (Signed)
Plan: Continue follow-up with primary care 

## 2019-09-06 NOTE — Progress Notes (Signed)
Patient seen in the office today and instructed on use of Spriva Respimat 1.70mcg.  Patient expressed understanding and demonstrated technique.  Ruth Colon [facility] 09/06/2019

## 2019-09-06 NOTE — Assessment & Plan Note (Addendum)
Status post 3 prednisone tapers Current wheezing, cough, congestion  Plan: Prednisone taper today Continue Symbicort 160 3 times daily We will add Spiriva Respimat 1.25 We will work to coordinate pulmonary function testing to be completed in March/2020 4-week follow-up with Dr. Elizbeth Squires signed for Ruth Colon  Work note to excuse from work till 09/09/2019

## 2019-09-06 NOTE — Patient Instructions (Addendum)
You were seen today by Lauraine Rinne, NP  for:   1. Moderate persistent allergic asthma with acute exacerbation  - Pulmonary function test; Future - predniSONE (DELTASONE) 10 MG tablet; Take 4 tablets (40 mg total) by mouth daily with breakfast for 3 days, THEN 3 tablets (30 mg total) daily with breakfast for 3 days, THEN 2 tablets (20 mg total) daily with breakfast for 3 days, THEN 1 tablet (10 mg total) daily with breakfast for 3 days.  Dispense: 30 tablet; Refill: 0  Continue Symbicort 160 >>> 2 puffs in the morning right when you wake up, rinse out your mouth after use, 12 hours later 2 puffs, rinse after use >>> Can increase to 3 times daily if symptoms persist >>> Take this daily, no matter what >>> This is not a rescue inhaler   Spiriva Respimat 1.25 >>> 2 puffs daily >>> Do this every day >>>This is not a rescue inhaler  Unfortunately pulmonary function testing will be delayed due to your 07/30/2019 Covid diagnosis.  We will work to get this completed as quickly as possible  I will discuss the Nucala start with Dr. Loanne Drilling, unfortunately looks like this may be held up due to the pulmonary function testing   2. Sjogren's syndrome, with unspecified organ involvement ([place])  Continue follow-up with primary care regarding this  We have ordered pulmonary function test  May consider CT imaging if symptoms not improving  3. Seasonal allergic rhinitis due to pollen  Continue Flonase  Continue Singulair  Please start taking a daily antihistamine:  >>>choose one of: zyrtec, claritin, allegra, or xyzal  >>>these are over the counter medications  >>>can choose generic option  >>>take daily  >>>this medication helps with allergies, post nasal drip, and cough     We recommend today:  Orders Placed This Encounter  Procedures  . Pulmonary function test    Standing Status:   Future    Standing Expiration Date:   09/05/2020    Scheduling Instructions:     After and around march  /8th    Order Specific Question:   Where should this test be performed?    Answer:   [facility]    Order Specific Question:   Full PFT: includes the following: basic spirometry, spirometry pre & post bronchodilator, diffusion capacity (DLCO), lung volumes    Answer:   Full PFT   Orders Placed This Encounter  Procedures  . Pulmonary function test   Meds ordered this encounter  Medications  . DISCONTD: predniSONE (DELTASONE) 10 MG tablet    Sig: Take 4 tablets (40 mg total) by mouth daily with breakfast for 3 days, THEN 3 tablets (30 mg total) daily with breakfast for 3 days, THEN 2 tablets (20 mg total) daily with breakfast for 3 days, THEN 1 tablet (10 mg total) daily with breakfast for 3 days.    Dispense:  30 tablet    Refill:  0  . predniSONE (DELTASONE) 10 MG tablet    Sig: Take 4 tablets (40 mg total) by mouth daily with breakfast for 3 days, THEN 3 tablets (30 mg total) daily with breakfast for 3 days, THEN 2 tablets (20 mg total) daily with breakfast for 3 days, THEN 1 tablet (10 mg total) daily with breakfast for 3 days.    Dispense:  30 tablet    Refill:  0    Follow Up:    Return in about 4 weeks (around 10/04/2019), or if symptoms worsen or fail to  improve, for Follow up with Dr. Everardo All.   Please do your part to reduce the spread of COVID-19:      Reduce your risk of any infection  and COVID19 by using the similar precautions used for avoiding the common cold or flu:  Marland Kitchen Wash your hands often with soap and warm water for at least 20 seconds.  If soap and water are not readily available, use an alcohol-based hand sanitizer with at least 60% alcohol.  . If coughing or sneezing, cover your mouth and nose by coughing or sneezing into the elbow areas of your shirt or coat, into a tissue or into your sleeve (not your hands). Drinda Butts A MASK when in public  . Avoid shaking hands with others and consider head nods or verbal greetings only. . Avoid touching your eyes,  nose, or mouth with unwashed hands.  . Avoid close contact with people who are sick. . Avoid places or events with large numbers of people in one location, like concerts or sporting events. . If you have some symptoms but not all symptoms, continue to monitor at home and seek medical attention if your symptoms worsen. . If you are having a medical emergency, call 911.   ADDITIONAL HEALTHCARE OPTIONS FOR PATIENTS  Page Telehealth / e-Visit: [email or web address]         [facility]: [phone]  [facility]: [phone]                   [facility]: [phone]     It is flu season:   >>> Best ways to protect herself from the flu: Receive the yearly flu vaccine, practice good hand hygiene washing with soap and also using hand sanitizer when available, eat a nutritious meals, get adequate rest, hydrate appropriately   Please contact the office if your symptoms worsen or you have concerns that you are not improving.   Thank you for choosing [facility] for your healthcare, and for allowing [place] to partner with you on your healthcare journey. I am thankful to be able to provide care to you today.   Elisha Headland FNP-C

## 2019-09-06 NOTE — Telephone Encounter (Signed)
Patient was seen today by Ruth Colon for an asthma flare up. The possibility of starting Nucala was mentioned during the OV. Ruth Colon will speak with Dr. Everardo All in regards to starting Nucala. Since patient lives in [place], I had the patient to sign the Nucala forms. Will hold onto them in my purple folder.

## 2019-10-01 ENCOUNTER — Telehealth: Payer: Self-pay | Admitting: Pulmonary Disease

## 2019-10-01 NOTE — Telephone Encounter (Signed)
Left message on pt's VM to cb to schedule PFT & COVID test.

## 2019-10-01 NOTE — Telephone Encounter (Signed)
10/01/2019  Pulmonary function testing has been ordered.  It does not look like this is been scheduled.  Patient is status post Covid on: 07/30/2019-SARS-CoV-2-positive  PCC's can we work to get the patient scheduled for PFT at the soonest availability.  If there is a cancellation can this patient be considered?  Is the earliest the patient could be scheduled March 8 per our current guidelines?  Or can she be tested sooner?  Elisha Headland FNP

## 2019-10-01 NOTE — Telephone Encounter (Signed)
Discussed this patient's case with Lauren.  Patient tested positive for Covid on 07/30/2019.  Patient was not hospitalized for this.  Patient was managed outpatient setting.  Patient needs pulmonary function testing to further evaluate her breathing.Per [facility]'s updated policies regarding retesting Covid patients patient would not need a repeat Covid test.  Patient can be scheduled for a pulmonary function test in the month of February/2021 at earliest available.Please put in the documentation of the appointment note that patient tested positive on 07/30/2019.  For follow-up questions can follow-up with Elisha Headland, FNP.Elisha Headland, FNP

## 2019-10-01 NOTE — Telephone Encounter (Signed)
Looking into this.

## 2019-10-02 ENCOUNTER — Telehealth: Payer: Self-pay | Admitting: Pulmonary Disease

## 2019-10-02 NOTE — Telephone Encounter (Signed)
Received new start form for Nucala from Elisha Headland NP. Form was signed and dated 10/02/19. Form and all required information was faxed by Misty Stanley on 10/02/19.  Will sign off and await decision.

## 2019-10-02 NOTE — Telephone Encounter (Signed)
FYI - I cancelled the covid test.

## 2019-10-02 NOTE — Telephone Encounter (Signed)
atc pt but VM is full.  Will try back in a little while.

## 2019-10-02 NOTE — Telephone Encounter (Signed)
Pt's PFT scheduled for 2/24 @ 9 here for COVID 2/20 @ 9:15

## 2019-10-02 NOTE — Telephone Encounter (Signed)
Thank you [place]! Ruth Colon

## 2019-10-04 NOTE — Telephone Encounter (Signed)
Received summary of benefits. Specialty pharmacy [facility], [phone], spoke with Cordelia Pen. Nucala PA initiated. Cordelia Pen stated clinical notes can be faxed to [phone]. Reference # [id number]. Requested notes faxed. Will wait for discission.

## 2019-10-08 NOTE — Telephone Encounter (Signed)
Called [facility] / care management to follow up on Nucala PA, x's 2. Both calls I was unable to hear representative clearly.  Second call representative asked if someone could call back at a later time. Will follow up later this week.

## 2019-10-12 ENCOUNTER — Other Ambulatory Visit (HOSPITAL_COMMUNITY): Payer: Commercial Managed Care - PPO

## 2019-10-15 NOTE — Telephone Encounter (Signed)
Attempted to contact pt's insurance company at [phone] per the pt's summary of benefits. Both times I attempted to call my call got disconnected waiting for representative to answer the line. Will try back later.

## 2019-10-16 ENCOUNTER — Ambulatory Visit (INDEPENDENT_AMBULATORY_CARE_PROVIDER_SITE_OTHER): Payer: Commercial Managed Care - PPO | Admitting: Pulmonary Disease

## 2019-10-16 ENCOUNTER — Other Ambulatory Visit: Payer: Self-pay

## 2019-10-16 DIAGNOSIS — J4541 Moderate persistent asthma with (acute) exacerbation: Secondary | ICD-10-CM | POA: Diagnosis not present

## 2019-10-16 LAB — PULMONARY FUNCTION TEST
DL/VA % pred: 131 %
DL/VA: 5.65 ml/min/mmHg/L
DLCO cor % pred: 110 %
DLCO cor: 23.64 ml/min/mmHg
DLCO unc % pred: 112 %
DLCO unc: 24.06 ml/min/mmHg
FEF 25-75 Post: 4.3 L/sec
FEF 25-75 Pre: 3.71 L/sec
FEF2575-%Change-Post: 15 %
FEF2575-%Pred-Post: 154 %
FEF2575-%Pred-Pre: 133 %
FEV1-%Change-Post: 3 %
FEV1-%Pred-Post: 85 %
FEV1-%Pred-Pre: 83 %
FEV1-Post: 2.45 L
FEV1-Pre: 2.37 L
FEV1FVC-%Change-Post: 3 %
FEV1FVC-%Pred-Pre: 111 %
FEV6-%Change-Post: 0 %
FEV6-%Pred-Post: 75 %
FEV6-%Pred-Pre: 75 %
FEV6-Post: 2.66 L
FEV6-Pre: 2.66 L
FEV6FVC-%Pred-Post: 102 %
FEV6FVC-%Pred-Pre: 102 %
FVC-%Change-Post: 0 %
FVC-%Pred-Post: 73 %
FVC-%Pred-Pre: 73 %
FVC-Post: 2.66 L
FVC-Pre: 2.66 L
Post FEV1/FVC ratio: 92 %
Post FEV6/FVC ratio: 100 %
Pre FEV1/FVC ratio: 89 %
Pre FEV6/FVC Ratio: 100 %
RV % pred: 98 %
RV: 1.79 L
TLC % pred: 92 %
TLC: 4.73 L

## 2019-10-16 NOTE — Progress Notes (Signed)
Full PFT performed today. °

## 2019-10-16 NOTE — Telephone Encounter (Signed)
Contacted pt's insurance to follow up on her PA for Nucala. Was advised that the PA that was submitted originally was for an inpatient administration which has been denied. Advised the rep that I spoke with that this medication is not given as an inpatient. New PA has been started on Cover My Meds per the rep's recommendation. Key: [id number] Will continue to follow up.

## 2019-10-17 ENCOUNTER — Telehealth: Payer: Self-pay | Admitting: Pulmonary Disease

## 2019-10-17 NOTE — Telephone Encounter (Signed)
10/17/2019  Reviewed pulmonary function testing.  Patient's pulmonary function test shows slight aspect of restriction.  No formal obstruction.  No real bronchodilator response.  Still believe this is sufficient enough for her clinical diagnosis of asthma.  No further recommendations or change at this time  Still need to proceed forward with Nucala start as already planned.  Patient should have follow-up scheduled with Dr. Everardo All sometime over the next 6 to 8 weeks.  Ruth Headland, FNP

## 2019-10-17 NOTE — Progress Notes (Signed)
Results sent in a telephone encounter.  Please see telephone encounter.Elisha Headland, FNP

## 2019-10-18 ENCOUNTER — Telehealth: Payer: Self-pay | Admitting: Pulmonary Disease

## 2019-10-18 NOTE — Telephone Encounter (Signed)
10/18/2019  Upon chart review, I can see the patient had an additional question regarding the covid vaccine.   We would still recommend the vaccine for you. Our hope is that your asthma will be better controlled when you are started on nucala.   You can always review this with Dr. Sherryll Burger as well. Can also be discussed further at next office visit with Dr. Everardo All.   Elisha Headland FNP

## 2019-10-18 NOTE — Telephone Encounter (Signed)
mychart message was sent by pt wanting to know if she needed to have a f/u. I called and spoke with pt and did get her scheduled for a f/u with Dr. Anne Shutter. 4/12. Nothing further needed.

## 2019-10-21 NOTE — Telephone Encounter (Signed)
Rochelle w/[facility] benefits [phone]  Limmie Patricia I 3 days ago   [place] states fax was sent for urgent PA for Nucala. Form just needs to be filled out and faxed back today if possible. Please advise. fax number on the form.    Incoming call   ---------------------------------------------- Call was received on 10/18/2019 but was not returned to [place]. I checked through all of the paperwork that I have and a PA form was no received for this.  Contacted [facility] Benefits but was transferred to a voicemail. LMTCB x1.

## 2019-10-21 NOTE — Telephone Encounter (Signed)
Spoke with Ruth Colon at [facility]. Advised her that I did not receive the fax from Friday. She is going to send this again to my attention. Will await fax.

## 2019-10-21 NOTE — Telephone Encounter (Signed)
See telephone encounter (10/02/2019)  

## 2019-10-23 NOTE — Telephone Encounter (Signed)
Received PA form from [facility]. This has been filled out and faxed back along with clinical information. Will await PA determination.

## 2019-10-25 ENCOUNTER — Telehealth: Payer: Self-pay | Admitting: Pulmonary Disease

## 2019-10-25 ENCOUNTER — Encounter: Payer: Self-pay | Admitting: Pulmonary Disease

## 2019-10-25 MED ORDER — NUCALA 100 MG ~~LOC~~ SOLR
100.0000 mg | SUBCUTANEOUS | 12 refills | Status: DC
Start: 2019-10-25 — End: 2019-12-03

## 2019-10-25 NOTE — Telephone Encounter (Signed)
Spoke with Vernona Rieger, she wanted to clarify the dosing on Nucala and to ask if pt was still on singular. I verified the dosing at 100mg  and advised pt is taking Singular. Nucala PA is approved and will be covered for 6 months.Nothing further is needed. 

## 2019-10-25 NOTE — Telephone Encounter (Signed)
Per telephone encounter 10/25/2018, Ruth Colon appeal has been approved through 04/26/2020. [facility] is the preferred specialty pharmacy per the summary of benefits. Rx has been sent in. Will continue to follow up.

## 2019-10-25 NOTE — Telephone Encounter (Signed)
Please see telephone encounter (10/02/2019).

## 2019-10-25 NOTE — Telephone Encounter (Signed)
Appeal letter has been written per Arlys John.  It has been signed and faxed to the [employer]. Will await decision.

## 2019-10-25 NOTE — Telephone Encounter (Signed)
Yes absolutely would like to do an appeal.  Patient is on Symbicort 160 3 times daily.  She has received multiple rounds of prednisone tapers recently within the last year.  Patient with elevated eosinophil count on lab work.  Unsure why Nucala would not be covered.  If the patient continues to receive denials does Nucala have a separate program that will cover the cost for the patient?  Can we check with their drug rep?  Ruth Headland, FNP

## 2019-10-25 NOTE — Telephone Encounter (Signed)
Received a fax from [facility] Benefits that the pt's PA has been denied. Denial letter states >> The plan medication coverage guidelines require that the patient attempt and fail treatment with prerequisite medication(s) prior to initiating treatment with the requested medication. Please utilize the plan formulary for additional information on prerequisite options.  Arlys John - please advise if you would like to proceed with an appeal. Thanks.

## 2019-10-29 ENCOUNTER — Telehealth: Payer: Self-pay | Admitting: Pulmonary Disease

## 2019-10-29 NOTE — Telephone Encounter (Signed)
Pt returned my call per Natoshia. When trying to call the pt back, I again did not receive an answer and her VM is still full.

## 2019-10-29 NOTE — Telephone Encounter (Signed)
[facility] to set up shipment. Was advised that their pharmacy is not in network with the pt's insurance. Contacted pt's insurance company at [phone]. Was advised that Nucala should come from [facility] in [place]. Contacted them at [phone], was advised that they would ship the medication to the pt and the pt could bring it with them to their appointments.  Attempted to contact pt to discuss this before I sent in the prescription. There was no answer and I could not leave a message due to her VM being full. Will try back.

## 2019-10-29 NOTE — Telephone Encounter (Signed)
See telephone encounter (10/02/2019)

## 2019-10-31 NOTE — Telephone Encounter (Addendum)
ATC pt, I did not receive an answer and her VM is full. Will try back.

## 2019-11-01 NOTE — Telephone Encounter (Signed)
LMTCB x1 for pt.  

## 2019-11-04 NOTE — Telephone Encounter (Signed)
LMTCB x2 for pt 

## 2019-11-05 NOTE — Telephone Encounter (Signed)
I have attempted to contact pt several times with no success or call back from pt. Per office protocol, message will be closed.

## 2019-11-27 ENCOUNTER — Telehealth: Payer: Self-pay | Admitting: Pulmonary Disease

## 2019-11-27 NOTE — Telephone Encounter (Signed)
Agreed.  Please let me know if there is anything I can do to help.Arlys John

## 2019-11-27 NOTE — Telephone Encounter (Signed)
[facility] Telephone Encounter  I contacted patient and we discussed reasons her overall asthma health since the this winter. She has been treated for asthma exacerbations 6 times with steroids (Dec x 3, Jan x 1, Feb x 1 and March x 1). She believes she was off of steroids when her last eosinophil count was checked in January (Abs 100) but she still had monthly exacerbations after this. We discussed how eosinophil levels can worsen during asthma flare-ups and contribute to her symptoms. Given the number of exacerbations and steroid dependence, I still recommend pursuing Nucala as this may provide benefit in symptoms and # of exacerbations. We discussed the risks and benefits of the medication and after addressing her questions, patient now wishes to pursue Nucala injections.  Plan Will send message to Biologic coordinator to contact and schedule patient for her first Nucala injection.  Mechele Collin, M.D. [facility] Pulmonary/Critical Care Medicine 11/27/2019 4:26 PM

## 2019-11-27 NOTE — Telephone Encounter (Signed)
Called and spoke with pt who stated she wants to hold off on doing the Nucala injections at this time due to her eosinophil levels being normal last couple times it has been checked. Also while speaking with pt, I let her know the info stated by Arlys John in regards to the PFT results and she verbalized understanding. Pt was scheduled for a f/u with Dr. Everardo All 4/12 but pt had to cancel due to her job. Pt stated she would call back later once she received her work schedule to make another appt with Dr. Everardo All.  Routing this to Dr. Everardo All as well as injections so they know pt wants to hold off on Nucala injections at this time.

## 2019-12-02 ENCOUNTER — Ambulatory Visit: Payer: Commercial Managed Care - PPO | Admitting: Pulmonary Disease

## 2019-12-03 NOTE — Telephone Encounter (Signed)
Me     9:35 AM Note   [facility] to set up shipment. Was advised that their pharmacy is not in network with the pt's insurance. Contacted pt's insurance company at [phone]. Was advised that Nucala should come from [facility] in [place]. Contacted them at [phone], was advised that they would ship the medication to the pt and the pt could bring it with them to their appointments.     --------------------------------------------------------------------------------- LMTCB x1 for pt to go over the above information before sending her prescription.

## 2019-12-04 NOTE — Telephone Encounter (Signed)
LMTCB x2 for pt 

## 2019-12-05 NOTE — Telephone Encounter (Signed)
LMTCB x3 for pt. We have attempted to contact pt several times with no success or call back from pt. This happened when we originally started this process. It's hard to get the pt started on therapy when she will not return our calls.

## 2020-02-17 ENCOUNTER — Telehealth: Payer: Self-pay | Admitting: Pulmonary Disease

## 2020-02-17 NOTE — Telephone Encounter (Signed)
02/17/2020  We have received preoperative risk assessment surgical clearance from [facility] orthopedic for the patient for a planned right knee arthroscopy under spinal anesthesia.  This is scheduled to be completed on 02/21/2020.  Patient's last office visit was in January/2021.  Where she was treated with prednisone for asthma exacerbation.  Patient was scheduled for a visit in April/2021 but the patient canceled.  I believe the patient needs to have a an office visit to further surgically clear her.  Surgical form was provided back to Lauren for follow-up.  Elisha Headland, FNP

## 2020-02-17 NOTE — Telephone Encounter (Signed)
Called and left a VM for patient to call the office to schedule an office visit for surgical clearance.

## 2020-02-18 NOTE — Telephone Encounter (Signed)
Pt calling back to sched appt for surgical clearance. Sched for 02/20/20 at w/ BW.

## 2020-02-20 ENCOUNTER — Other Ambulatory Visit: Payer: Self-pay

## 2020-02-20 ENCOUNTER — Encounter: Payer: Self-pay | Admitting: Primary Care

## 2020-02-20 ENCOUNTER — Ambulatory Visit: Payer: Commercial Managed Care - PPO | Admitting: Primary Care

## 2020-02-20 VITALS — BP 118/72 | HR 78 | Temp 97.6°F | Ht 63.0 in | Wt 231.0 lb

## 2020-02-20 DIAGNOSIS — Z01818 Encounter for other preprocedural examination: Secondary | ICD-10-CM | POA: Diagnosis not present

## 2020-02-20 DIAGNOSIS — J4540 Moderate persistent asthma, uncomplicated: Secondary | ICD-10-CM | POA: Diagnosis not present

## 2020-02-20 DIAGNOSIS — J4541 Moderate persistent asthma with (acute) exacerbation: Secondary | ICD-10-CM

## 2020-02-20 NOTE — [facility] Notes (Signed)
@Patient  ID: , female    DOB: 1971/07/18, 49 y.o.   MRN: [id number]  Chief Complaint  Patient presents with  . Follow-up    surgicial clearance    Referring provider: [id number], MD  HPI: 49 year old female, never smoked.  Past medical history significant for Sjogren's syndrome, COVID-19, GERD, moderate persistent allergic asthma, allergic rhinitis.  Patient of Dr. 44, last seen by pulmonary nurse practitioner on 09/06/19.  02/20/2020 Patient is scheduled for right knee arthroscopy under conscious sedation and spinal anesthesia with [facility] for 02/21/20.   Her asthma symptoms have been well controlled over the last several months. States that she is feeling alot better since this winter and getting covid. No recent exacerbations requiring antibiotics or prednisone. She typically uses her Symbicort and/or albuterol as needed only. She hasn't needed to use her inhaler in the last month. She still has some gerd symptoms which is managed by her PCP, she is on Protonix currently. Denies fever, chills, sweats, cough, chest tightness or wheezing.   Pulmonary function testing: February 2021-FVC 2.66 (73%), FEV1 2.45 (85%), ratio 92.  Allergies  Allergen Reactions  . Morphine     Other reaction(s): Decreased Blood Pressure  . Cephalexin Nausea Only and Rash  . Ipratropium     Self-reported tingling and mild swelling with difficulty talking  . Nitrofurantoin Macrocrystal Rash  . Latex Rash  . Macrobid [Nitrofurantoin] Rash    Immunization History  Administered Date(s) Administered  . Influenza,inj,Quad PF,6+ Mos 06/11/2019  . PFIZER SARS-COV-2 Vaccination 12/04/2019, 12/25/2019    Past Medical History:  Diagnosis Date  . Arthritis   . Asthma   . GERD (gastroesophageal reflux disease)   . Sinus problem     Tobacco History: Social History   Tobacco Use  Smoking Status Never Smoker  Smokeless Tobacco Never Used   Counseling given: Not  Answered   Outpatient Medications Prior to Visit  Medication Sig Dispense Refill  . aspirin EC 81 MG tablet Take 81 mg by mouth daily.    . budesonide-formoterol (SYMBICORT) 160-4.5 MCG/ACT inhaler Inhale 2 puffs into the lungs 2 (two) times daily.    . famotidine (PEPCID) 20 MG tablet Take 20 mg by mouth 2 (two) times daily.    . fluticasone (FLONASE) 50 MCG/ACT nasal spray Place 1 spray into both nostrils daily. 16 g 5  . ibuprofen (ADVIL,MOTRIN) 800 MG tablet Take 800 mg by mouth every 8 (eight) hours as needed for moderate pain.    . montelukast (SINGULAIR) 10 MG tablet Take 1 tablet (10 mg total) by mouth at bedtime. 30 tablet 11  . omeprazole (PRILOSEC) 20 MG capsule Take 20 mg by mouth daily.    02/24/2020 PROAIR HFA 108 (90 Base) MCG/ACT inhaler Inhale 2 puffs into the lungs every 6 (six) hours as needed for wheezing or shortness of breath.     . Tiotropium Bromide Monohydrate (SPIRIVA RESPIMAT) 1.25 MCG/ACT AERS Inhale 2 puffs into the lungs daily. 8 g 0   No facility-administered medications prior to visit.   Review of Systems  Review of Systems  Constitutional: Negative.   Respiratory: Negative for cough, chest tightness, shortness of breath and wheezing.   Cardiovascular: Negative.    Physical Exam  BP 118/72 (BP Location: Right Arm, Cuff Size: Large)   Pulse 78   Temp 97.6 F (36.4 C) (Oral)   Ht 5\' 3"  (1.6 m)   Wt 231 lb (104.8 kg)   SpO2 97%   BMI 40.92 kg/m  Physical Exam Constitutional:      Appearance: Normal appearance.  HENT:     Head: Normocephalic and atraumatic.     Mouth/Throat:     Mouth: Mucous membranes are moist.     Pharynx: Oropharynx is clear.  Cardiovascular:     Rate and Rhythm: Normal rate and regular rhythm.  Pulmonary:     Effort: Pulmonary effort is normal.     Breath sounds: Normal breath sounds. No wheezing, rhonchi or rales.  Neurological:     General: No focal deficit present.     Mental Status: She is alert and oriented to person,  place, and time. Mental status is at baseline.  Psychiatric:        Mood and Affect: Mood normal.        Behavior: Behavior normal.        Thought Content: Thought content normal.        Judgment: Judgment normal.      Lab Results:  CBC    Component Value Date/Time   WBC 7.0 12/28/2015 1111   RBC 4.54 12/28/2015 1111   HGB 13.4 12/28/2015 1111   HCT 39.9 12/28/2015 1111   PLT 341 12/28/2015 1111   MCV 87.9 12/28/2015 1111   MCH 29.5 12/28/2015 1111   MCHC 33.6 12/28/2015 1111   RDW 13.1 12/28/2015 1111   LYMPHSABS 1.6 12/28/2015 1111   MONOABS 0.5 12/28/2015 1111   EOSABS 0.1 12/28/2015 1111   BASOSABS 0.0 12/28/2015 1111    BMET    Component Value Date/Time   NA 139 12/28/2015 1111   K 3.9 12/28/2015 1111   CL 105 12/28/2015 1111   CO2 27 12/28/2015 1111   GLUCOSE 111 (H) 12/28/2015 1111   BUN 16 12/28/2015 1111   CREATININE 0.62 12/28/2015 1111   CALCIUM 9.1 12/28/2015 1111   GFRNONAA >60 12/28/2015 1111   GFRAA >60 12/28/2015 1111    BNP    Component Value Date/Time   BNP 17.0 12/28/2015 1111    ProBNP No results found for: PROBNP  Imaging: No results found.   Assessment & Plan:   Pre-op evaluation Cleared from a pulmonary standpoint to have right knee surgery with [facility] on 02/21/2020  Recommendations - Encourage early ambulation, incentive spirometry and DVT prophylaxis to prevent stop pneumonia and blood clots  Moderate persistent allergic asthma with acute exacerbation - Stable, well controlled on present treatment. She has had no recent exacerbations and rarely requires her ICS/LABA or SABA inhaler  - Instructed patient to use Symbicort tonight and tomorrow morning day of surgery - Continue Singulair 10mg  at bedtime as prescribed - Follow-up in 6 months with Dr. , NP 02/20/2020

## 2020-02-20 NOTE — Assessment & Plan Note (Signed)
Cleared from a pulmonary standpoint to have right knee surgery with [facility] on 02/21/2020  Recommendations - Encourage early ambulation, incentive spirometry and DVT prophylaxis to prevent stop pneumonia and blood clots

## 2020-02-20 NOTE — Patient Instructions (Signed)
Pleasure seeing you today Ruth Colon  You are cleared from a pulmonary standpoint to have right knee surgery with [facility] on 02/21/2020  Recommendations - Use your Symbicort tonight and tomorrow morning day of surgery - Continue Singulair as prescribed - Encourage early ambulation, incentive spirometry and DVT prophylaxis to prevent stop pneumonia and blood clots  Follow-up: 6 months with Dr. Everardo All

## 2020-02-20 NOTE — Assessment & Plan Note (Signed)
-   Stable, well controlled on present treatment. She has had no recent exacerbations and rarely requires her ICS/LABA or SABA inhaler  - Instructed patient to use Symbicort tonight and tomorrow morning day of surgery - Continue Singulair 10mg  at bedtime as prescribed - Follow-up in 6 months with Dr. 

## 2021-02-18 ENCOUNTER — Other Ambulatory Visit (HOSPITAL_COMMUNITY): Payer: Self-pay

## 2021-02-18 MED ORDER — FLUTICASONE PROPIONATE 50 MCG/ACT NA SUSP
NASAL | 4 refills | Status: DC
  Filled 2021-05-11: qty 16, 30d supply, fill #0
  Filled 2021-12-22: qty 16, 30d supply, fill #1

## 2021-02-18 MED ORDER — ALBUTEROL SULFATE (2.5 MG/3ML) 0.083% IN NEBU: INHALATION_SOLUTION | RESPIRATORY_TRACT | 1 refills | Status: AC

## 2021-02-18 MED ORDER — PANTOPRAZOLE SODIUM 40 MG PO TBEC
40.0000 mg | DELAYED_RELEASE_TABLET | Freq: Two times a day (BID) | ORAL | 4 refills | Status: DC
  Filled 2021-05-11: qty 180, 90d supply, fill #0
  Filled 2021-12-28: qty 120, 60d supply, fill #1

## 2021-02-18 MED ORDER — BUDESONIDE-FORMOTEROL FUMARATE 160-4.5 MCG/ACT IN AERO
INHALATION_SPRAY | RESPIRATORY_TRACT | 0 refills | Status: DC
  Filled 2021-04-07: qty 10.2, 30d supply, fill #0

## 2021-02-18 MED ORDER — IBUPROFEN 800 MG PO TABS: 800.0000 mg | ORAL_TABLET | Freq: Three times a day (TID) | ORAL | 1 refills | Status: DC | PRN

## 2021-03-05 ENCOUNTER — Other Ambulatory Visit (HOSPITAL_COMMUNITY): Payer: Self-pay

## 2021-03-16 DIAGNOSIS — I10 Essential (primary) hypertension: Secondary | ICD-10-CM | POA: Diagnosis not present

## 2021-03-16 DIAGNOSIS — R079 Chest pain, unspecified: Secondary | ICD-10-CM | POA: Diagnosis not present

## 2021-03-16 DIAGNOSIS — Z299 Encounter for prophylactic measures, unspecified: Secondary | ICD-10-CM | POA: Diagnosis not present

## 2021-03-16 DIAGNOSIS — Z789 Other specified health status: Secondary | ICD-10-CM | POA: Diagnosis not present

## 2021-03-16 DIAGNOSIS — Z720 Tobacco use: Secondary | ICD-10-CM | POA: Diagnosis not present

## 2021-03-16 DIAGNOSIS — Z6841 Body Mass Index (BMI) 40.0 and over, adult: Secondary | ICD-10-CM | POA: Diagnosis not present

## 2021-03-17 ENCOUNTER — Other Ambulatory Visit (HOSPITAL_COMMUNITY): Payer: Self-pay

## 2021-03-23 ENCOUNTER — Other Ambulatory Visit: Payer: Self-pay

## 2021-03-23 ENCOUNTER — Ambulatory Visit: Payer: Self-pay | Admitting: Cardiology

## 2021-03-23 DIAGNOSIS — I83893 Varicose veins of bilateral lower extremities with other complications: Secondary | ICD-10-CM

## 2021-03-30 ENCOUNTER — Other Ambulatory Visit (HOSPITAL_COMMUNITY): Payer: Self-pay

## 2021-04-07 ENCOUNTER — Other Ambulatory Visit (HOSPITAL_COMMUNITY): Payer: Self-pay

## 2021-04-09 ENCOUNTER — Encounter (HOSPITAL_COMMUNITY): Payer: Self-pay

## 2021-04-09 ENCOUNTER — Encounter (HOSPITAL_COMMUNITY): Payer: 59

## 2021-04-15 ENCOUNTER — Other Ambulatory Visit (HOSPITAL_COMMUNITY): Payer: Self-pay

## 2021-04-26 ENCOUNTER — Telehealth: Payer: 59 | Admitting: Emergency Medicine

## 2021-04-26 DIAGNOSIS — N3000 Acute cystitis without hematuria: Secondary | ICD-10-CM

## 2021-04-26 MED ORDER — SULFAMETHOXAZOLE-TRIMETHOPRIM 800-160 MG PO TABS: 1.0000 | ORAL_TABLET | Freq: Two times a day (BID) | ORAL | 0 refills | Status: DC

## 2021-04-26 NOTE — Addendum Note (Signed)
Addended by: Cathlyn Parsons on: 04/26/2021 01:30 PM   Modules accepted: Orders

## 2021-04-26 NOTE — [facility] Notes (Signed)
E-Visit for Urinary Problems  We are sorry that you are not feeling well.  Here is how we plan to help!  Based on what you shared with me it looks like you most likely have a simple urinary tract infection.  A UTI (Urinary Tract Infection) is a bacterial infection of the bladder.  Most cases of urinary tract infections are simple to treat but a key part of your care is to encourage you to drink plenty of fluids and watch your symptoms carefully.  I have prescribed Bactrim DS One tablet twice a day for 5 days.  Your symptoms should gradually improve. Call us if the burning in your urine worsens, you develop worsening fever, back pain or pelvic pain or if your symptoms do not resolve after completing the antibiotic.  Urinary tract infections can be prevented by drinking plenty of water to keep your body hydrated.  Also be sure when you wipe, wipe from front to back and don't hold it in!  If possible, empty your bladder every 4 hours.  HOME CARE Drink plenty of fluids Compete the full course of the antibiotics even if the symptoms resolve Remember, when you need to go.go. Holding in your urine can increase the likelihood of getting a UTI! GET HELP RIGHT AWAY IF: You cannot urinate You get a high fever Worsening back pain occurs You see blood in your urine You feel sick to your stomach or throw up You feel like you are going to pass out  MAKE SURE YOU  Understand these instructions. Will watch your condition. Will get help right away if you are not doing well or get worse.   Thank you for choosing an e-visit.  Your e-visit answers were reviewed by a board certified advanced clinical practitioner to complete your personal care plan. Depending upon the condition, your plan could have included both over the counter or prescription medications.  Please review your pharmacy choice. Make sure the pharmacy is open so you can pick up prescription now. If there is a problem, you may contact  your provider through MyChart messaging and have the prescription routed to another pharmacy.  Your safety is important to us. If you have drug allergies check your prescription carefully.   For the next 24 hours you can use MyChart to ask questions about today's visit, request a non-urgent call back, or ask for a work or school excuse. You will get an email in the next two days asking about your experience. I hope that your e-visit has been valuable and will speed your recovery.  I have spent 5 minutes in review of e-visit questionnaire, review and updating patient chart, medical decision making and response to patient.   Arriana Lohmann, PhD, FNP-BC   

## 2021-05-04 ENCOUNTER — Ambulatory Visit: Payer: Self-pay | Admitting: Cardiology

## 2021-05-11 ENCOUNTER — Other Ambulatory Visit (HOSPITAL_COMMUNITY): Payer: Self-pay

## 2021-08-03 DIAGNOSIS — R059 Cough, unspecified: Secondary | ICD-10-CM | POA: Diagnosis not present

## 2021-08-03 DIAGNOSIS — R490 Dysphonia: Secondary | ICD-10-CM | POA: Diagnosis not present

## 2021-08-03 DIAGNOSIS — Z6841 Body Mass Index (BMI) 40.0 and over, adult: Secondary | ICD-10-CM | POA: Diagnosis not present

## 2021-08-03 DIAGNOSIS — J101 Influenza due to other identified influenza virus with other respiratory manifestations: Secondary | ICD-10-CM | POA: Diagnosis not present

## 2021-08-03 DIAGNOSIS — Z299 Encounter for prophylactic measures, unspecified: Secondary | ICD-10-CM | POA: Diagnosis not present

## 2021-08-26 DIAGNOSIS — R0789 Other chest pain: Secondary | ICD-10-CM | POA: Diagnosis not present

## 2021-08-26 DIAGNOSIS — Z885 Allergy status to narcotic agent status: Secondary | ICD-10-CM | POA: Diagnosis not present

## 2021-08-26 DIAGNOSIS — K219 Gastro-esophageal reflux disease without esophagitis: Secondary | ICD-10-CM | POA: Diagnosis not present

## 2021-08-26 DIAGNOSIS — J45909 Unspecified asthma, uncomplicated: Secondary | ICD-10-CM | POA: Diagnosis not present

## 2021-08-26 DIAGNOSIS — R9431 Abnormal electrocardiogram [ECG] [EKG]: Secondary | ICD-10-CM | POA: Diagnosis not present

## 2021-08-26 DIAGNOSIS — R079 Chest pain, unspecified: Secondary | ICD-10-CM | POA: Diagnosis not present

## 2021-08-27 DIAGNOSIS — R220 Localized swelling, mass and lump, head: Secondary | ICD-10-CM | POA: Diagnosis not present

## 2021-08-27 DIAGNOSIS — R002 Palpitations: Secondary | ICD-10-CM | POA: Diagnosis not present

## 2021-08-27 DIAGNOSIS — K219 Gastro-esophageal reflux disease without esophagitis: Secondary | ICD-10-CM | POA: Diagnosis not present

## 2021-08-27 DIAGNOSIS — Z6841 Body Mass Index (BMI) 40.0 and over, adult: Secondary | ICD-10-CM | POA: Diagnosis not present

## 2021-08-27 DIAGNOSIS — I10 Essential (primary) hypertension: Secondary | ICD-10-CM | POA: Diagnosis not present

## 2021-08-27 DIAGNOSIS — Z299 Encounter for prophylactic measures, unspecified: Secondary | ICD-10-CM | POA: Diagnosis not present

## 2021-12-01 DIAGNOSIS — I10 Essential (primary) hypertension: Secondary | ICD-10-CM | POA: Diagnosis not present

## 2021-12-01 DIAGNOSIS — Z299 Encounter for prophylactic measures, unspecified: Secondary | ICD-10-CM | POA: Diagnosis not present

## 2021-12-01 DIAGNOSIS — Z789 Other specified health status: Secondary | ICD-10-CM | POA: Diagnosis not present

## 2021-12-01 DIAGNOSIS — J020 Streptococcal pharyngitis: Secondary | ICD-10-CM | POA: Diagnosis not present

## 2021-12-03 DIAGNOSIS — J45909 Unspecified asthma, uncomplicated: Secondary | ICD-10-CM | POA: Diagnosis not present

## 2021-12-03 DIAGNOSIS — Z299 Encounter for prophylactic measures, unspecified: Secondary | ICD-10-CM | POA: Diagnosis not present

## 2021-12-03 DIAGNOSIS — J020 Streptococcal pharyngitis: Secondary | ICD-10-CM | POA: Diagnosis not present

## 2021-12-22 ENCOUNTER — Other Ambulatory Visit (HOSPITAL_COMMUNITY): Payer: Self-pay

## 2021-12-22 MED ORDER — MOMETASONE FUROATE 50 MCG/ACT NA SUSP
NASAL | 4 refills | Status: DC
  Filled 2021-12-22 – 2022-02-18 (×2): qty 17, 30d supply, fill #0

## 2021-12-28 ENCOUNTER — Other Ambulatory Visit (HOSPITAL_COMMUNITY): Payer: Self-pay

## 2022-01-18 DIAGNOSIS — J020 Streptococcal pharyngitis: Secondary | ICD-10-CM | POA: Diagnosis not present

## 2022-01-18 DIAGNOSIS — Z299 Encounter for prophylactic measures, unspecified: Secondary | ICD-10-CM | POA: Diagnosis not present

## 2022-01-18 DIAGNOSIS — J45909 Unspecified asthma, uncomplicated: Secondary | ICD-10-CM | POA: Diagnosis not present

## 2022-01-18 DIAGNOSIS — I10 Essential (primary) hypertension: Secondary | ICD-10-CM | POA: Diagnosis not present

## 2022-01-18 DIAGNOSIS — Z789 Other specified health status: Secondary | ICD-10-CM | POA: Diagnosis not present

## 2022-01-19 ENCOUNTER — Other Ambulatory Visit (HOSPITAL_COMMUNITY): Payer: Self-pay

## 2022-02-18 ENCOUNTER — Other Ambulatory Visit (HOSPITAL_COMMUNITY): Payer: Self-pay

## 2022-03-07 DIAGNOSIS — E538 Deficiency of other specified B group vitamins: Secondary | ICD-10-CM | POA: Diagnosis not present

## 2022-03-07 DIAGNOSIS — E7800 Pure hypercholesterolemia, unspecified: Secondary | ICD-10-CM | POA: Diagnosis not present

## 2022-03-07 DIAGNOSIS — R5383 Other fatigue: Secondary | ICD-10-CM | POA: Diagnosis not present

## 2022-03-07 DIAGNOSIS — Z6841 Body Mass Index (BMI) 40.0 and over, adult: Secondary | ICD-10-CM | POA: Diagnosis not present

## 2022-03-07 DIAGNOSIS — Z299 Encounter for prophylactic measures, unspecified: Secondary | ICD-10-CM | POA: Diagnosis not present

## 2022-03-07 DIAGNOSIS — Z1331 Encounter for screening for depression: Secondary | ICD-10-CM | POA: Diagnosis not present

## 2022-03-07 DIAGNOSIS — Z0000 Encounter for general adult medical examination without abnormal findings: Secondary | ICD-10-CM | POA: Diagnosis not present

## 2022-03-07 DIAGNOSIS — Z79899 Other long term (current) drug therapy: Secondary | ICD-10-CM | POA: Diagnosis not present

## 2022-03-07 DIAGNOSIS — I10 Essential (primary) hypertension: Secondary | ICD-10-CM | POA: Diagnosis not present

## 2022-03-08 ENCOUNTER — Other Ambulatory Visit (HOSPITAL_COMMUNITY): Payer: Self-pay

## 2022-03-09 ENCOUNTER — Other Ambulatory Visit (HOSPITAL_COMMUNITY): Payer: Self-pay

## 2022-03-09 MED ORDER — ALBUTEROL SULFATE HFA 108 (90 BASE) MCG/ACT IN AERS
INHALATION_SPRAY | RESPIRATORY_TRACT | 3 refills | Status: DC
  Filled 2022-03-09: qty 6.7, 50d supply, fill #0

## 2022-03-11 ENCOUNTER — Other Ambulatory Visit (HOSPITAL_COMMUNITY): Payer: Self-pay

## 2022-03-15 DIAGNOSIS — E559 Vitamin D deficiency, unspecified: Secondary | ICD-10-CM | POA: Diagnosis not present

## 2022-03-15 DIAGNOSIS — E7800 Pure hypercholesterolemia, unspecified: Secondary | ICD-10-CM | POA: Diagnosis not present

## 2022-03-15 DIAGNOSIS — Z79899 Other long term (current) drug therapy: Secondary | ICD-10-CM | POA: Diagnosis not present

## 2022-03-15 DIAGNOSIS — Z0000 Encounter for general adult medical examination without abnormal findings: Secondary | ICD-10-CM | POA: Diagnosis not present

## 2022-03-15 DIAGNOSIS — R5383 Other fatigue: Secondary | ICD-10-CM | POA: Diagnosis not present

## 2022-04-22 ENCOUNTER — Telehealth: Payer: 59 | Admitting: Physician Assistant

## 2022-04-22 ENCOUNTER — Other Ambulatory Visit (HOSPITAL_COMMUNITY): Payer: Self-pay

## 2022-04-22 DIAGNOSIS — H1030 Unspecified acute conjunctivitis, unspecified eye: Secondary | ICD-10-CM

## 2022-04-22 MED ORDER — POLYMYXIN B-TRIMETHOPRIM 10000-0.1 UNIT/ML-% OP SOLN
OPHTHALMIC | 0 refills | Status: DC
  Filled 2022-04-22: qty 10, 5d supply, fill #0

## 2022-04-22 NOTE — [facility] Notes (Signed)

## 2022-04-22 NOTE — [facility] Notes (Signed)
I have spent 5 minutes in review of e-visit questionnaire, review and updating patient chart, medical decision making and response to patient.   Salomon Ganser Cody Lunden Stieber, PA-C    

## 2022-05-05 ENCOUNTER — Other Ambulatory Visit: Payer: Self-pay | Admitting: *Deleted

## 2022-05-05 DIAGNOSIS — I83893 Varicose veins of bilateral lower extremities with other complications: Secondary | ICD-10-CM

## 2022-05-12 ENCOUNTER — Other Ambulatory Visit: Payer: Self-pay | Admitting: *Deleted

## 2022-05-12 DIAGNOSIS — I83893 Varicose veins of bilateral lower extremities with other complications: Secondary | ICD-10-CM

## 2022-05-15 NOTE — Progress Notes (Signed)
Requested by:  Kirstie Peri, MD [address] [place],  [place] [address]  Reason for consultation: varicose veins    History of Present Illness   Ruth Colon is a 51 y.o. (07-13-1971) female who presents for evaluation of varicose veins of her left thigh.  She states that she has had varicose veins and spider veins for "her whole life".  They have gotten worse over the past couple of years.  At the end of the day, her legs will feel achy and heavy.  The varicose vein along her left thigh will hurt the most and cause a persistent throbbing pain.  Standing or sitting for long periods of time will make her symptoms worse.  She currently uses knee-high compression stockings and she states that they help with her minimal leg swelling.  She denies any bleeding or ulceration events.  She denies any history of DVT or previous vein procedures.  She usually takes ibuprofen for the pain.   She endorses a family history of varicose veins.  She works as a Buyer, retail. Past Medical History:  Diagnosis Date   Arthritis    Asthma    GERD (gastroesophageal reflux disease)    Sinus problem     Past Surgical History:  Procedure Laterality Date   ABDOMINAL HYSTERECTOMY  01/2012   TENDON REPAIR      Social History   Socioeconomic History   Marital status: Married    Spouse name: Not on file   Number of children: Not on file   Years of education: Not on file   Highest education level: Not on file  Occupational History   Not on file  Tobacco Use   Smoking status: Never   Smokeless tobacco: Never  Vaping Use   Vaping Use: Never used  Substance and Sexual Activity   Alcohol use: No   Drug use: No   Sexual activity: Not on file  Other Topics Concern   Not on file  Social History Narrative   Not on file   Social Determinants of Health   Financial Resource Strain: Not on file  Food Insecurity: Not on file  Transportation Needs: Not on file  Physical Activity: Not on file   Stress: Not on file  Social Connections: Not on file  Intimate Partner Violence: Not on file    Family History  Problem Relation Age of Onset   Allergies Mother    Asthma Mother    Heart disease Mother    Rheum arthritis Mother    Hypertension Father    Diabetes Father     Current Outpatient Medications  Medication Sig Dispense Refill   albuterol (PROVENTIL) (2.5 MG/3ML) 0.083% nebulizer solution Inhale 1 vial via nebulizer 4 times a day as needed 360 mL 1   albuterol (VENTOLIN HFA) 108 (90 Base) MCG/ACT inhaler Inhale 1 puff by mouth into the lungs every 6 hours 6.7 g 3   budesonide-formoterol (SYMBICORT) 160-4.5 MCG/ACT inhaler Inhale 2 puffs into the lungs 2 (two) times daily.     budesonide-formoterol (SYMBICORT) 160-4.5 MCG/ACT inhaler Inhale 2 puffs into the lungs 2 times a day 10.2 g 0   fluticasone (FLONASE) 50 MCG/ACT nasal spray Place 1 spray into both nostrils daily. 16 g 5   fluticasone (FLONASE) 50 MCG/ACT nasal spray Use 2 sprays in each nostril daily 16 g 4   mometasone (NASONEX) 50 MCG/ACT nasal spray Place 1 sprays into the nose once daily 17 g 4   montelukast (SINGULAIR) 10 MG  tablet Take 1 tablet (10 mg total) by mouth at bedtime. 30 tablet 11   pantoprazole (PROTONIX) 40 MG tablet Take 1 tablet (40 mg total) by mouth 2 (two) times daily. 60 tablet 4   PROAIR HFA 108 (90 Base) MCG/ACT inhaler Inhale 2 puffs into the lungs every 6 (six) hours as needed for wheezing or shortness of breath.      sulfamethoxazole-trimethoprim (BACTRIM DS) 800-160 MG tablet Take 1 tablet by mouth 2 (two) times daily. 6 tablet 0   trimethoprim-polymyxin b (POLYTRIM) ophthalmic solution Apply 1-2 drops into affected eye four times daily x 5 days. 10 mL 0   No current facility-administered medications for this visit.    Allergies  Allergen Reactions   Morphine     Other reaction(s): Decreased Blood Pressure   Cephalexin Nausea Only and Rash   Ipratropium     Self-reported tingling  and mild swelling with difficulty talking   Nitrofurantoin Macrocrystal Rash   Latex Rash   Macrobid [Nitrofurantoin] Rash    REVIEW OF SYSTEMS (negative unless checked):   Cardiac:  []  Chest pain or chest pressure? []  Shortness of breath upon activity? []  Shortness of breath when lying flat? []  Irregular heart rhythm?  Vascular:  [x]  Pain in calf, thigh, or hip brought on by walking? []  Pain in feet at night that wakes you up from your sleep? []  Blood clot in your veins? [x]  Leg swelling?  Pulmonary:  []  Oxygen at home? []  Productive cough? []  Wheezing?  Neurologic:  []  Sudden weakness in arms or legs? []  Sudden numbness in arms or legs? []  Sudden onset of difficult speaking or slurred speech? []  Temporary loss of vision in one eye? []  Problems with dizziness?  Gastrointestinal:  []  Blood in stool? []  Vomited blood?  Genitourinary:  []  Burning when urinating? []  Blood in urine?  Psychiatric:  []  Major depression  Hematologic:  []  Bleeding problems? []  Problems with blood clotting?  Dermatologic:  []  Rashes or ulcers?  Constitutional:  []  Fever or chills?  Ear/Nose/Throat:  []  Change in hearing? []  Nose bleeds? []  Sore throat?  Musculoskeletal:  []  Back pain? []  Joint pain? []  Muscle pain?   Physical Examination     Vitals:   05/16/22 1412  BP: 118/71  Pulse: 75  Resp: 14  Temp: 97.9 F (36.6 C)  TempSrc: Temporal  SpO2: 98%  Weight: 234 lb (106.1 kg)  Height: 5\' 3"  (1.6 m)   Body mass index is 41.45 kg/m.  General:  WDWN in NAD; vital signs documented above Gait: Not observed HENT: WNL, normocephalic Pulmonary: normal non-labored breathing , without rales, rhonchi, wheezing Cardiac: regular HR, without murmurs without carotid bruit Abdomen: soft, NT, no masses Skin: without rashes Vascular: 2+ DP pulses Extremities: with varicose veins, with reticular veins, without edema, without stasis pigmentation, without  lipodermatosclerosis, without ulcers Musculoskeletal: no muscle wasting or atrophy  Neurologic: A&O X 3;  No focal weakness or paresthesias are detected Psychiatric:  The pt has Normal affect.  Non-invasive Vascular Imaging   LLE Venous Insufficiency Duplex (05/16/2022):  LEFT          Reflux NoRefluxReflux TimeDiameter cmsComments                          Yes                                   +--------------+---------+------+-----------+------------+--------+  CFV           no                                              +--------------+---------+------+-----------+------------+--------+  FV mid        no                                              +--------------+---------+------+-----------+------------+--------+  Popliteal     no                                              +--------------+---------+------+-----------+------------+--------+  GSV at SFJ              yes    >500 ms      0.98              +--------------+---------+------+-----------+------------+--------+  GSV prox thighno                            0.25              +--------------+---------+------+-----------+------------+--------+  GSV mid thigh no                            0.24              +--------------+---------+------+-----------+------------+--------+  GSV dist thigh          yes    >500 ms      0.37              +--------------+---------+------+-----------+------------+--------+  GSV at knee             yes    >500 ms      0.35              +--------------+---------+------+-----------+------------+--------+  GSV prox calf           yes    >500 ms      0.58              +--------------+---------+------+-----------+------------+--------+  GSV mid calf  no                            0.23              +--------------+---------+------+-----------+------------+--------+  SSV Pop Fossa no                            0.26               +--------------+---------+------+-----------+------------+--------+  SSV prox calf           yes    >500 ms      0.30              +--------------+---------+------+-----------+------------+--------+  SSV mid calf  no                            0.18              +--------------+---------+------+-----------+------------+--------+  AASV O                  yes    >500 ms      0.58              +--------------+---------+------+-----------+------------+--------+  AASV P        no                            0.31              +--------------+---------+------+-----------+------------+--------+   Medical Decision Making   Ruth Colon is a 51 y.o. female who presents with: LLE varicose veins with pain  Based on the patient's duplex study, she has reflux in her greater saphenous vein at her saphenofemoral junction, distal thigh, knee, and proximal calf.  However her vein is smaller than 4 mm outside of her saphenofemoral junction, therefore she would not be a good candidate for venous ablation. She has no evidence of DVT or SVT on duplex study She does have a very painful varicose vein along her left lateral thigh.  I have explained to her that she may qualify for stab phlebectomy or vein stripping of this vein upon further evaluation.  I have recommended her use of 20-30 mm thigh high compression stockings and need for 3 month trial of such.  I have also given her a tip sheet on how to elevate her legs properly. She would also like to get the spider/reticular veins removed on her right leg because they cause her pain daily.  Izora Gala will call her to set up an appointment for sclerotherapy, and I have also given her Nancy's card. The patient will follow up in 3 months with [facility]   Gerri Lins, PA-C [facility]: [phone]  05/15/2022, 6:09 PM  Clinic MD: Trula Slade

## 2022-05-16 ENCOUNTER — Ambulatory Visit (HOSPITAL_COMMUNITY)
Admission: RE | Admit: 2022-05-16 | Discharge: 2022-05-16 | Disposition: A | Discharge status: Home / Self Care | Payer: 59 | Source: Ambulatory Visit | Attending: Surgery | Admitting: Surgery

## 2022-05-16 ENCOUNTER — Ambulatory Visit (INDEPENDENT_AMBULATORY_CARE_PROVIDER_SITE_OTHER): Payer: 59 | Admitting: Physician Assistant

## 2022-05-16 VITALS — BP 118/71 | HR 75 | Temp 97.9°F | Resp 14 | Ht 63.0 in | Wt 234.0 lb

## 2022-05-16 DIAGNOSIS — I83812 Varicose veins of left lower extremities with pain: Secondary | ICD-10-CM | POA: Diagnosis not present

## 2022-05-16 DIAGNOSIS — I83893 Varicose veins of bilateral lower extremities with other complications: Secondary | ICD-10-CM | POA: Diagnosis not present

## 2022-05-30 ENCOUNTER — Ambulatory Visit: Payer: 59

## 2022-06-13 DIAGNOSIS — H5213 Myopia, bilateral: Secondary | ICD-10-CM | POA: Diagnosis not present

## 2022-06-21 ENCOUNTER — Other Ambulatory Visit (HOSPITAL_COMMUNITY): Payer: Self-pay

## 2022-06-24 ENCOUNTER — Other Ambulatory Visit (HOSPITAL_COMMUNITY): Payer: Self-pay

## 2022-06-25 ENCOUNTER — Other Ambulatory Visit (HOSPITAL_COMMUNITY): Payer: Self-pay

## 2022-06-25 MED ORDER — TYRVAYA 0.03 MG/ACT NA SOLN
1.0000 | Freq: Two times a day (BID) | NASAL | 12 refills | Status: DC
  Filled 2022-06-25 – 2022-11-29 (×2): qty 8.4, 30d supply, fill #0

## 2022-07-08 ENCOUNTER — Other Ambulatory Visit (HOSPITAL_COMMUNITY): Payer: Self-pay

## 2022-07-09 ENCOUNTER — Other Ambulatory Visit (HOSPITAL_COMMUNITY): Payer: Self-pay

## 2022-07-09 MED ORDER — PANTOPRAZOLE SODIUM 40 MG PO TBEC
40.0000 mg | DELAYED_RELEASE_TABLET | Freq: Two times a day (BID) | ORAL | 5 refills | Status: DC
  Filled 2022-07-09: qty 60, 30d supply, fill #0
  Filled 2022-11-29: qty 60, 30d supply, fill #1
  Filled 2022-11-30: qty 60, 30d supply, fill #0
  Filled 2023-06-29: qty 60, 30d supply, fill #1

## 2022-07-15 ENCOUNTER — Other Ambulatory Visit (HOSPITAL_COMMUNITY): Payer: Self-pay

## 2022-07-16 ENCOUNTER — Other Ambulatory Visit (HOSPITAL_COMMUNITY): Payer: Self-pay

## 2022-07-20 ENCOUNTER — Other Ambulatory Visit (HOSPITAL_COMMUNITY): Payer: Self-pay

## 2022-07-22 ENCOUNTER — Other Ambulatory Visit (HOSPITAL_COMMUNITY): Payer: Self-pay

## 2022-07-26 ENCOUNTER — Other Ambulatory Visit (HOSPITAL_COMMUNITY): Payer: Self-pay

## 2022-08-06 ENCOUNTER — Other Ambulatory Visit (HOSPITAL_COMMUNITY): Payer: Self-pay

## 2022-08-08 ENCOUNTER — Other Ambulatory Visit (HOSPITAL_COMMUNITY): Payer: Self-pay

## 2022-08-22 ENCOUNTER — Telehealth: Payer: Commercial Managed Care - PPO | Admitting: Physician Assistant

## 2022-08-22 DIAGNOSIS — J019 Acute sinusitis, unspecified: Secondary | ICD-10-CM | POA: Diagnosis not present

## 2022-08-22 DIAGNOSIS — B9689 Other specified bacterial agents as the cause of diseases classified elsewhere: Secondary | ICD-10-CM

## 2022-08-22 MED ORDER — FLUTICASONE PROPIONATE 50 MCG/ACT NA SUSP: 2.0000 | Freq: Every day | NASAL | 0 refills | Status: DC

## 2022-08-22 MED ORDER — DOXYCYCLINE HYCLATE 100 MG PO TABS: 100.0000 mg | ORAL_TABLET | Freq: Two times a day (BID) | ORAL | 0 refills | Status: DC

## 2022-08-22 NOTE — [facility] Notes (Signed)

## 2022-08-24 DIAGNOSIS — Z6841 Body Mass Index (BMI) 40.0 and over, adult: Secondary | ICD-10-CM | POA: Diagnosis not present

## 2022-08-24 DIAGNOSIS — Z299 Encounter for prophylactic measures, unspecified: Secondary | ICD-10-CM | POA: Diagnosis not present

## 2022-08-24 DIAGNOSIS — I10 Essential (primary) hypertension: Secondary | ICD-10-CM | POA: Diagnosis not present

## 2022-08-24 DIAGNOSIS — J069 Acute upper respiratory infection, unspecified: Secondary | ICD-10-CM | POA: Diagnosis not present

## 2022-09-22 ENCOUNTER — Other Ambulatory Visit (HOSPITAL_COMMUNITY): Payer: Self-pay

## 2022-10-14 DIAGNOSIS — R5383 Other fatigue: Secondary | ICD-10-CM | POA: Diagnosis not present

## 2022-10-14 DIAGNOSIS — S8990XA Unspecified injury of unspecified lower leg, initial encounter: Secondary | ICD-10-CM | POA: Diagnosis not present

## 2022-10-14 DIAGNOSIS — Z6841 Body Mass Index (BMI) 40.0 and over, adult: Secondary | ICD-10-CM | POA: Diagnosis not present

## 2022-11-29 ENCOUNTER — Other Ambulatory Visit (HOSPITAL_COMMUNITY): Payer: Self-pay

## 2022-11-29 ENCOUNTER — Telehealth: Payer: Commercial Managed Care - PPO | Admitting: Physician Assistant

## 2022-11-29 ENCOUNTER — Other Ambulatory Visit: Payer: Self-pay | Admitting: Physician Assistant

## 2022-11-29 DIAGNOSIS — J019 Acute sinusitis, unspecified: Secondary | ICD-10-CM

## 2022-11-29 DIAGNOSIS — B9689 Other specified bacterial agents as the cause of diseases classified elsewhere: Secondary | ICD-10-CM

## 2022-11-29 MED ORDER — FLUTICASONE PROPIONATE 50 MCG/ACT NA SUSP: 2.0000 | Freq: Every day | NASAL | 0 refills | Status: DC

## 2022-11-29 MED ORDER — AMOXICILLIN-POT CLAVULANATE 875-125 MG PO TABS: 1.0000 | ORAL_TABLET | Freq: Two times a day (BID) | ORAL | 0 refills | Status: DC

## 2022-11-29 MED ORDER — FLUTICASONE PROPIONATE 50 MCG/ACT NA SUSP
2.0000 | Freq: Every day | NASAL | 0 refills | Status: DC
  Filled 2022-11-30: qty 16, 30d supply, fill #0

## 2022-11-29 NOTE — [facility] Notes (Signed)

## 2022-11-29 NOTE — Addendum Note (Signed)
Addended by: Margaretann Loveless on: 11/29/2022 02:53 PM   Modules accepted: Orders

## 2022-11-30 ENCOUNTER — Other Ambulatory Visit: Payer: Self-pay

## 2022-11-30 ENCOUNTER — Other Ambulatory Visit (HOSPITAL_BASED_OUTPATIENT_CLINIC_OR_DEPARTMENT_OTHER): Payer: Self-pay

## 2022-12-02 DIAGNOSIS — U071 COVID-19: Secondary | ICD-10-CM | POA: Diagnosis not present

## 2022-12-02 DIAGNOSIS — R5383 Other fatigue: Secondary | ICD-10-CM | POA: Diagnosis not present

## 2022-12-12 ENCOUNTER — Ambulatory Visit: Payer: Commercial Managed Care - PPO | Admitting: Surgery

## 2022-12-30 ENCOUNTER — Other Ambulatory Visit: Payer: Self-pay

## 2023-02-21 DIAGNOSIS — S134XXA Sprain of ligaments of cervical spine, initial encounter: Secondary | ICD-10-CM | POA: Diagnosis not present

## 2023-02-21 DIAGNOSIS — M4004 Postural kyphosis, thoracic region: Secondary | ICD-10-CM | POA: Diagnosis not present

## 2023-02-21 DIAGNOSIS — M531 Cervicobrachial syndrome: Secondary | ICD-10-CM | POA: Diagnosis not present

## 2023-03-06 DIAGNOSIS — M4004 Postural kyphosis, thoracic region: Secondary | ICD-10-CM | POA: Diagnosis not present

## 2023-03-06 DIAGNOSIS — S134XXA Sprain of ligaments of cervical spine, initial encounter: Secondary | ICD-10-CM | POA: Diagnosis not present

## 2023-03-06 DIAGNOSIS — M531 Cervicobrachial syndrome: Secondary | ICD-10-CM | POA: Diagnosis not present

## 2023-03-12 ENCOUNTER — Telehealth: Payer: Commercial Managed Care - PPO | Admitting: Physician Assistant

## 2023-03-12 DIAGNOSIS — B9689 Other specified bacterial agents as the cause of diseases classified elsewhere: Secondary | ICD-10-CM | POA: Diagnosis not present

## 2023-03-12 DIAGNOSIS — J019 Acute sinusitis, unspecified: Secondary | ICD-10-CM | POA: Diagnosis not present

## 2023-03-12 MED ORDER — AMOXICILLIN-POT CLAVULANATE 875-125 MG PO TABS: 1.0000 | ORAL_TABLET | Freq: Two times a day (BID) | ORAL | 0 refills | Status: DC

## 2023-03-12 NOTE — Progress Notes (Signed)
E-Visit for Sinus Problems  We are sorry that you are not feeling well.  Here is how we plan to help!  Based on what you have shared with me it looks like you have sinusitis.  Sinusitis is inflammation and infection in the sinus cavities of the head.  Based on your presentation I believe you most likely have Acute Bacterial Sinusitis.  This is an infection caused by bacteria and is treated with antibiotics. I have prescribed Augmentin 875mg/125mg one tablet twice daily with food, for 7 days. You may use an oral decongestant such as Mucinex D or if you have glaucoma or high blood pressure use plain Mucinex. Saline nasal spray help and can safely be used as often as needed for congestion.  If you develop worsening sinus pain, fever or notice severe headache and vision changes, or if symptoms are not better after completion of antibiotic, please schedule an appointment with a health care provider.    Sinus infections are not as easily transmitted as other respiratory infection, however we still recommend that you avoid close contact with loved ones, especially the very young and elderly.  Remember to wash your hands thoroughly throughout the day as this is the number one way to prevent the spread of infection!  Home Care: Only take medications as instructed by your medical team. Complete the entire course of an antibiotic. Do not take these medications with alcohol. A steam or ultrasonic humidifier can help congestion.  You can place a towel over your head and breathe in the steam from hot water coming from a faucet. Avoid close contacts especially the very young and the elderly. Cover your mouth when you cough or sneeze. Always remember to wash your hands.  Get Help Right Away If: You develop worsening fever or sinus pain. You develop a severe head ache or visual changes. Your symptoms persist after you have completed your treatment plan.  Make sure you Understand these instructions. Will watch  your condition. Will get help right away if you are not doing well or get worse.  Thank you for choosing an e-visit.  Your e-visit answers were reviewed by a board certified advanced clinical practitioner to complete your personal care plan. Depending upon the condition, your plan could have included both over the counter or prescription medications.  Please review your pharmacy choice. Make sure the pharmacy is open so you can pick up prescription now. If there is a problem, you may contact your provider through MyChart messaging and have the prescription routed to another pharmacy.  Your safety is important to us. If you have drug allergies check your prescription carefully.   For the next 24 hours you can use MyChart to ask questions about today's visit, request a non-urgent call back, or ask for a work or school excuse. You will get an email in the next two days asking about your experience. I hope that your e-visit has been valuable and will speed your recovery.  I have spent 5 minutes in review of e-visit questionnaire, review and updating patient chart, medical decision making and response to patient.   Jennifer M Burnette, PA-C  

## 2023-03-13 ENCOUNTER — Other Ambulatory Visit (HOSPITAL_BASED_OUTPATIENT_CLINIC_OR_DEPARTMENT_OTHER): Payer: Self-pay

## 2023-03-13 DIAGNOSIS — Z299 Encounter for prophylactic measures, unspecified: Secondary | ICD-10-CM | POA: Diagnosis not present

## 2023-03-13 DIAGNOSIS — R0981 Nasal congestion: Secondary | ICD-10-CM | POA: Diagnosis not present

## 2023-03-13 DIAGNOSIS — J309 Allergic rhinitis, unspecified: Secondary | ICD-10-CM | POA: Diagnosis not present

## 2023-03-13 DIAGNOSIS — J45901 Unspecified asthma with (acute) exacerbation: Secondary | ICD-10-CM | POA: Diagnosis not present

## 2023-03-13 MED ORDER — DULERA 200-5 MCG/ACT IN AERO
2.0000 | INHALATION_SPRAY | Freq: Two times a day (BID) | RESPIRATORY_TRACT | 3 refills | Status: DC
  Filled 2023-03-13 – 2023-03-30 (×3): qty 13, 30d supply, fill #0
  Filled 2023-05-19: qty 13, 30d supply, fill #1
  Filled 2023-06-29: qty 13, 30d supply, fill #2
  Filled 2023-08-03: qty 13, 30d supply, fill #3

## 2023-03-13 MED ORDER — MOMETASONE FUROATE 50 MCG/ACT NA SUSP
2.0000 | Freq: Two times a day (BID) | NASAL | 2 refills | Status: DC
  Filled 2023-03-13 – 2023-06-29 (×3): qty 51, 90d supply, fill #0

## 2023-03-14 ENCOUNTER — Other Ambulatory Visit: Payer: Self-pay

## 2023-03-14 ENCOUNTER — Other Ambulatory Visit (HOSPITAL_BASED_OUTPATIENT_CLINIC_OR_DEPARTMENT_OTHER): Payer: Self-pay

## 2023-03-15 ENCOUNTER — Other Ambulatory Visit (HOSPITAL_BASED_OUTPATIENT_CLINIC_OR_DEPARTMENT_OTHER): Payer: Self-pay

## 2023-03-18 ENCOUNTER — Other Ambulatory Visit (HOSPITAL_BASED_OUTPATIENT_CLINIC_OR_DEPARTMENT_OTHER): Payer: Self-pay

## 2023-03-20 ENCOUNTER — Other Ambulatory Visit (HOSPITAL_BASED_OUTPATIENT_CLINIC_OR_DEPARTMENT_OTHER): Payer: Self-pay

## 2023-03-20 MED ORDER — FLUTICASONE FUROATE-VILANTEROL 100-25 MCG/ACT IN AEPB
1.0000 | INHALATION_SPRAY | Freq: Every day | RESPIRATORY_TRACT | 3 refills | Status: DC
  Filled 2023-03-20: qty 60, 60d supply, fill #0

## 2023-03-20 MED ORDER — ALBUTEROL SULFATE HFA 108 (90 BASE) MCG/ACT IN AERS
1.0000 | INHALATION_SPRAY | Freq: Four times a day (QID) | RESPIRATORY_TRACT | 3 refills | Status: DC
  Filled 2023-03-20: qty 6.7, 30d supply, fill #0
  Filled 2023-05-19: qty 6.7, 30d supply, fill #1
  Filled 2023-06-29: qty 6.7, 30d supply, fill #2
  Filled 2023-07-24: qty 6.7, 30d supply, fill #3
  Filled 2023-08-23: qty 6.7, 30d supply, fill #4
  Filled 2023-09-28: qty 6.7, 30d supply, fill #5
  Filled 2023-11-14: qty 6.7, 30d supply, fill #6
  Filled 2024-01-18: qty 6.7, 30d supply, fill #7
  Filled 2024-02-06 – 2024-02-13 (×2): qty 6.7, 30d supply, fill #8
  Filled 2024-03-19: qty 6.7, 30d supply, fill #0

## 2023-03-21 ENCOUNTER — Other Ambulatory Visit (HOSPITAL_BASED_OUTPATIENT_CLINIC_OR_DEPARTMENT_OTHER): Payer: Self-pay

## 2023-03-23 ENCOUNTER — Other Ambulatory Visit (HOSPITAL_BASED_OUTPATIENT_CLINIC_OR_DEPARTMENT_OTHER): Payer: Self-pay

## 2023-03-29 ENCOUNTER — Other Ambulatory Visit (HOSPITAL_BASED_OUTPATIENT_CLINIC_OR_DEPARTMENT_OTHER): Payer: Self-pay

## 2023-03-30 ENCOUNTER — Other Ambulatory Visit (HOSPITAL_BASED_OUTPATIENT_CLINIC_OR_DEPARTMENT_OTHER): Payer: Self-pay

## 2023-04-06 DIAGNOSIS — Z1331 Encounter for screening for depression: Secondary | ICD-10-CM | POA: Diagnosis not present

## 2023-04-06 DIAGNOSIS — E7800 Pure hypercholesterolemia, unspecified: Secondary | ICD-10-CM | POA: Diagnosis not present

## 2023-04-06 DIAGNOSIS — I10 Essential (primary) hypertension: Secondary | ICD-10-CM | POA: Diagnosis not present

## 2023-04-06 DIAGNOSIS — Z0000 Encounter for general adult medical examination without abnormal findings: Secondary | ICD-10-CM | POA: Diagnosis not present

## 2023-04-06 DIAGNOSIS — R5383 Other fatigue: Secondary | ICD-10-CM | POA: Diagnosis not present

## 2023-04-06 DIAGNOSIS — Z79899 Other long term (current) drug therapy: Secondary | ICD-10-CM | POA: Diagnosis not present

## 2023-04-06 DIAGNOSIS — Z299 Encounter for prophylactic measures, unspecified: Secondary | ICD-10-CM | POA: Diagnosis not present

## 2023-04-10 DIAGNOSIS — Z299 Encounter for prophylactic measures, unspecified: Secondary | ICD-10-CM | POA: Diagnosis not present

## 2023-04-10 DIAGNOSIS — R7303 Prediabetes: Secondary | ICD-10-CM | POA: Diagnosis not present

## 2023-04-10 DIAGNOSIS — I10 Essential (primary) hypertension: Secondary | ICD-10-CM | POA: Diagnosis not present

## 2023-04-19 ENCOUNTER — Other Ambulatory Visit (HOSPITAL_BASED_OUTPATIENT_CLINIC_OR_DEPARTMENT_OTHER): Payer: Self-pay

## 2023-04-23 ENCOUNTER — Telehealth: Payer: Commercial Managed Care - PPO | Admitting: Nurse Practitioner

## 2023-04-23 DIAGNOSIS — K047 Periapical abscess without sinus: Secondary | ICD-10-CM

## 2023-04-23 MED ORDER — CLINDAMYCIN HCL 300 MG PO CAPS
300.0000 mg | ORAL_CAPSULE | Freq: Three times a day (TID) | ORAL | 0 refills | Status: AC
Start: 2023-04-23 — End: 2023-04-30

## 2023-04-23 NOTE — [facility] Notes (Signed)

## 2023-04-23 NOTE — Progress Notes (Signed)
I have spent 5 minutes in review of e-visit questionnaire, review and updating patient chart, medical decision making and response to patient.  ° °Zelda W Fleming, NP ° °  °

## 2023-05-01 ENCOUNTER — Other Ambulatory Visit (HOSPITAL_BASED_OUTPATIENT_CLINIC_OR_DEPARTMENT_OTHER): Payer: Self-pay

## 2023-05-01 DIAGNOSIS — K047 Periapical abscess without sinus: Secondary | ICD-10-CM | POA: Diagnosis not present

## 2023-05-01 DIAGNOSIS — Z299 Encounter for prophylactic measures, unspecified: Secondary | ICD-10-CM | POA: Diagnosis not present

## 2023-05-01 DIAGNOSIS — J309 Allergic rhinitis, unspecified: Secondary | ICD-10-CM | POA: Diagnosis not present

## 2023-05-01 DIAGNOSIS — I10 Essential (primary) hypertension: Secondary | ICD-10-CM | POA: Diagnosis not present

## 2023-05-01 DIAGNOSIS — E119 Type 2 diabetes mellitus without complications: Secondary | ICD-10-CM | POA: Diagnosis not present

## 2023-05-01 DIAGNOSIS — R109 Unspecified abdominal pain: Secondary | ICD-10-CM | POA: Diagnosis not present

## 2023-05-01 MED ORDER — MOUNJARO 2.5 MG/0.5ML ~~LOC~~ SOAJ
2.5000 mg | SUBCUTANEOUS | 0 refills | Status: DC
  Filled 2023-05-01 – 2023-05-05 (×2): qty 2, 28d supply, fill #0

## 2023-05-01 MED ORDER — MONTELUKAST SODIUM 10 MG PO TABS
10.0000 mg | ORAL_TABLET | Freq: Every evening | ORAL | 2 refills | Status: AC
  Filled 2023-05-01: qty 30, 30d supply, fill #0
  Filled 2023-06-29 – 2023-10-12 (×2): qty 30, 30d supply, fill #1

## 2023-05-02 ENCOUNTER — Other Ambulatory Visit: Payer: Self-pay

## 2023-05-05 ENCOUNTER — Other Ambulatory Visit (HOSPITAL_BASED_OUTPATIENT_CLINIC_OR_DEPARTMENT_OTHER): Payer: Self-pay

## 2023-05-08 ENCOUNTER — Other Ambulatory Visit (HOSPITAL_BASED_OUTPATIENT_CLINIC_OR_DEPARTMENT_OTHER): Payer: Self-pay

## 2023-05-09 ENCOUNTER — Other Ambulatory Visit (HOSPITAL_BASED_OUTPATIENT_CLINIC_OR_DEPARTMENT_OTHER): Payer: Self-pay

## 2023-05-09 MED ORDER — FLULAVAL 0.5 ML IM SUSY
PREFILLED_SYRINGE | Freq: Once | INTRAMUSCULAR | 0 refills | Status: AC
  Filled 2023-05-09: qty 0.5, 1d supply, fill #0

## 2023-05-16 ENCOUNTER — Other Ambulatory Visit (HOSPITAL_BASED_OUTPATIENT_CLINIC_OR_DEPARTMENT_OTHER): Payer: Self-pay

## 2023-05-19 ENCOUNTER — Other Ambulatory Visit: Payer: Self-pay

## 2023-05-22 ENCOUNTER — Other Ambulatory Visit (HOSPITAL_COMMUNITY): Payer: Self-pay

## 2023-05-22 ENCOUNTER — Other Ambulatory Visit (HOSPITAL_BASED_OUTPATIENT_CLINIC_OR_DEPARTMENT_OTHER): Payer: Self-pay

## 2023-05-22 MED ORDER — MOUNJARO 5 MG/0.5ML ~~LOC~~ SOAJ
5.0000 mg | SUBCUTANEOUS | 0 refills | Status: DC
  Filled 2023-05-22 – 2023-05-31 (×5): qty 2, 28d supply, fill #0

## 2023-05-23 ENCOUNTER — Other Ambulatory Visit (HOSPITAL_COMMUNITY): Payer: Self-pay

## 2023-05-26 ENCOUNTER — Other Ambulatory Visit (HOSPITAL_BASED_OUTPATIENT_CLINIC_OR_DEPARTMENT_OTHER): Payer: Self-pay

## 2023-05-26 ENCOUNTER — Other Ambulatory Visit (HOSPITAL_COMMUNITY): Payer: Self-pay

## 2023-05-26 MED ORDER — MOUNJARO 7.5 MG/0.5ML ~~LOC~~ SOAJ
7.5000 mg | SUBCUTANEOUS | 2 refills | Status: DC
  Filled 2023-05-26 – 2023-06-29 (×4): qty 2, 28d supply, fill #0
  Filled 2023-07-05 – 2023-07-24 (×2): qty 2, 28d supply, fill #1
  Filled 2023-08-23: qty 2, 28d supply, fill #2

## 2023-05-29 ENCOUNTER — Other Ambulatory Visit (HOSPITAL_BASED_OUTPATIENT_CLINIC_OR_DEPARTMENT_OTHER): Payer: Self-pay

## 2023-05-30 ENCOUNTER — Other Ambulatory Visit (HOSPITAL_BASED_OUTPATIENT_CLINIC_OR_DEPARTMENT_OTHER): Payer: Self-pay

## 2023-05-31 ENCOUNTER — Other Ambulatory Visit (HOSPITAL_BASED_OUTPATIENT_CLINIC_OR_DEPARTMENT_OTHER): Payer: Self-pay

## 2023-06-12 ENCOUNTER — Other Ambulatory Visit (HOSPITAL_BASED_OUTPATIENT_CLINIC_OR_DEPARTMENT_OTHER): Payer: Self-pay

## 2023-06-12 MED ORDER — FLUTICASONE PROPIONATE 50 MCG/ACT NA SUSP
2.0000 | Freq: Every day | NASAL | 0 refills | Status: AC
  Filled 2023-06-12: qty 16, 30d supply, fill #0

## 2023-06-12 MED ORDER — AMOXICILLIN-POT CLAVULANATE 875-125 MG PO TABS
1.0000 | ORAL_TABLET | Freq: Two times a day (BID) | ORAL | 0 refills | Status: DC
  Filled 2023-06-12: qty 20, 10d supply, fill #0

## 2023-06-26 DIAGNOSIS — I10 Essential (primary) hypertension: Secondary | ICD-10-CM | POA: Diagnosis not present

## 2023-06-26 DIAGNOSIS — R233 Spontaneous ecchymoses: Secondary | ICD-10-CM | POA: Diagnosis not present

## 2023-06-26 DIAGNOSIS — E7800 Pure hypercholesterolemia, unspecified: Secondary | ICD-10-CM | POA: Diagnosis not present

## 2023-06-26 DIAGNOSIS — E1169 Type 2 diabetes mellitus with other specified complication: Secondary | ICD-10-CM | POA: Diagnosis not present

## 2023-06-26 DIAGNOSIS — Z299 Encounter for prophylactic measures, unspecified: Secondary | ICD-10-CM | POA: Diagnosis not present

## 2023-06-29 ENCOUNTER — Other Ambulatory Visit (HOSPITAL_BASED_OUTPATIENT_CLINIC_OR_DEPARTMENT_OTHER): Payer: Self-pay

## 2023-06-29 ENCOUNTER — Other Ambulatory Visit: Payer: Self-pay

## 2023-06-30 ENCOUNTER — Other Ambulatory Visit (HOSPITAL_BASED_OUTPATIENT_CLINIC_OR_DEPARTMENT_OTHER): Payer: Self-pay

## 2023-07-03 ENCOUNTER — Other Ambulatory Visit (HOSPITAL_BASED_OUTPATIENT_CLINIC_OR_DEPARTMENT_OTHER): Payer: Self-pay

## 2023-07-03 MED ORDER — MOUNJARO 5 MG/0.5ML ~~LOC~~ SOAJ
5.0000 mg | SUBCUTANEOUS | 2 refills | Status: DC
  Filled 2023-07-03 – 2023-07-05 (×3): qty 2, 28d supply, fill #0

## 2023-07-05 ENCOUNTER — Other Ambulatory Visit (HOSPITAL_BASED_OUTPATIENT_CLINIC_OR_DEPARTMENT_OTHER): Payer: Self-pay

## 2023-07-10 ENCOUNTER — Telehealth: Payer: Commercial Managed Care - PPO | Admitting: Physician Assistant

## 2023-07-10 DIAGNOSIS — B9689 Other specified bacterial agents as the cause of diseases classified elsewhere: Secondary | ICD-10-CM

## 2023-07-10 DIAGNOSIS — J019 Acute sinusitis, unspecified: Secondary | ICD-10-CM | POA: Diagnosis not present

## 2023-07-10 MED ORDER — DOXYCYCLINE HYCLATE 100 MG PO TABS: 100.0000 mg | ORAL_TABLET | Freq: Two times a day (BID) | ORAL | 0 refills | Status: DC

## 2023-07-10 NOTE — [facility] Notes (Signed)

## 2023-07-19 DIAGNOSIS — E1169 Type 2 diabetes mellitus with other specified complication: Secondary | ICD-10-CM | POA: Diagnosis not present

## 2023-07-19 DIAGNOSIS — N390 Urinary tract infection, site not specified: Secondary | ICD-10-CM | POA: Diagnosis not present

## 2023-07-24 ENCOUNTER — Other Ambulatory Visit: Payer: Self-pay

## 2023-08-24 ENCOUNTER — Telehealth: Payer: Commercial Managed Care - PPO | Admitting: Physician Assistant

## 2023-08-24 DIAGNOSIS — H109 Unspecified conjunctivitis: Secondary | ICD-10-CM

## 2023-08-24 MED ORDER — POLYMYXIN B-TRIMETHOPRIM 10000-0.1 UNIT/ML-% OP SOLN: 1.0000 [drp] | OPHTHALMIC | 0 refills | Status: AC

## 2023-08-24 NOTE — Progress Notes (Signed)
 E-Visit for [employer]   We are sorry that you are not feeling well.  Here is how we plan to help!  Based on what you have shared with me it looks like you have conjunctivitis.  Conjunctivitis is a common inflammatory or infectious condition of the eye that is often referred to as "pink eye".  In most cases it is contagious (viral or bacterial). However, not all conjunctivitis requires antibiotics (ex. Allergic).  We have made appropriate suggestions for you based upon your presentation.  I have prescribed Polytrim Ophthalmic drops 1-2 drops 4 times a day times 5 days  Pink eye can be highly contagious.  It is typically spread through direct contact with secretions, or contaminated objects or surfaces that one may have touched.  Strict handwashing is suggested with soap and water is urged.  If not available, use alcohol based had sanitizer.  Avoid unnecessary touching of the eye.  If you wear contact lenses, you will need to refrain from wearing them until you see no white discharge from the eye for at least 24 hours after being on medication.  You should see symptom improvement in 1-2 days after starting the medication regimen.  Call us if symptoms are not improved in 1-2 days.  Home Care: Wash your hands often! Do not wear your contacts until you complete your treatment plan. Avoid sharing towels, bed linen, personal items with a person who has pink eye. See attention for anyone in your home with similar symptoms.  Get Help Right Away If: Your symptoms do not improve. You develop blurred or loss of vision. Your symptoms worsen (increased discharge, pain or redness)   Thank you for choosing an e-visit.  Your e-visit answers were reviewed by a board certified advanced clinical practitioner to complete your personal care plan. Depending upon the condition, your plan could have included both over the counter or prescription medications.  Please review your pharmacy choice. Make sure the  pharmacy is open so you can pick up prescription now. If there is a problem, you may contact your provider through [employer] and have the prescription routed to another pharmacy.  Your safety is important to [place]. If you have drug allergies check your prescription carefully.   For the next 24 hours you can use MyChart to ask questions about today's visit, request a non-urgent call back, or ask for a work or school excuse. You will get an email in the next two days asking about your experience. I hope that your e-visit has been valuable and will speed your recovery.  I have spent 5 minutes in review of e-visit questionnaire, review and updating patient chart, medical decision making and response to patient.   Margaretann Loveless, PA-C

## 2023-08-30 ENCOUNTER — Other Ambulatory Visit: Payer: Self-pay | Admitting: Medical Genetics

## 2023-09-06 ENCOUNTER — Other Ambulatory Visit (HOSPITAL_BASED_OUTPATIENT_CLINIC_OR_DEPARTMENT_OTHER): Payer: Self-pay

## 2023-09-06 MED ORDER — MOUNJARO 5 MG/0.5ML ~~LOC~~ SOAJ
5.0000 mg | SUBCUTANEOUS | 2 refills | Status: AC
  Filled 2023-09-06 (×2): qty 2, 28d supply, fill #0

## 2023-09-12 ENCOUNTER — Other Ambulatory Visit (HOSPITAL_BASED_OUTPATIENT_CLINIC_OR_DEPARTMENT_OTHER): Payer: Self-pay

## 2023-09-12 ENCOUNTER — Other Ambulatory Visit (HOSPITAL_COMMUNITY): Payer: Self-pay

## 2023-09-12 MED ORDER — MOUNJARO 7.5 MG/0.5ML ~~LOC~~ SOAJ
7.5000 mg | SUBCUTANEOUS | 2 refills | Status: AC
  Filled 2023-09-12 – 2024-03-19 (×7): qty 2, 28d supply, fill #0
  Filled 2024-06-26: qty 2, 28d supply, fill #1

## 2023-09-12 MED ORDER — MOUNJARO 7.5 MG/0.5ML ~~LOC~~ SOAJ
7.5000 mg | SUBCUTANEOUS | 2 refills | Status: AC
  Filled 2023-09-12 – 2023-10-27 (×2): qty 2, 28d supply, fill #0

## 2023-09-13 ENCOUNTER — Other Ambulatory Visit (HOSPITAL_BASED_OUTPATIENT_CLINIC_OR_DEPARTMENT_OTHER): Payer: Self-pay

## 2023-09-20 ENCOUNTER — Other Ambulatory Visit (HOSPITAL_BASED_OUTPATIENT_CLINIC_OR_DEPARTMENT_OTHER): Payer: Self-pay

## 2023-09-20 DIAGNOSIS — H6690 Otitis media, unspecified, unspecified ear: Secondary | ICD-10-CM | POA: Diagnosis not present

## 2023-09-20 DIAGNOSIS — E119 Type 2 diabetes mellitus without complications: Secondary | ICD-10-CM | POA: Diagnosis not present

## 2023-09-20 DIAGNOSIS — I10 Essential (primary) hypertension: Secondary | ICD-10-CM | POA: Diagnosis not present

## 2023-09-20 DIAGNOSIS — Z299 Encounter for prophylactic measures, unspecified: Secondary | ICD-10-CM | POA: Diagnosis not present

## 2023-09-20 MED ORDER — MOUNJARO 10 MG/0.5ML ~~LOC~~ SOAJ
10.0000 mg | SUBCUTANEOUS | 2 refills | Status: DC
  Filled 2023-09-20 – 2023-09-24 (×2): qty 2, 28d supply, fill #0
  Filled 2023-10-22 – 2024-01-18 (×4): qty 2, 28d supply, fill #1
  Filled 2024-01-27 – 2024-02-13 (×3): qty 2, 28d supply, fill #2

## 2023-09-24 ENCOUNTER — Other Ambulatory Visit (HOSPITAL_BASED_OUTPATIENT_CLINIC_OR_DEPARTMENT_OTHER): Payer: Self-pay

## 2023-09-25 ENCOUNTER — Other Ambulatory Visit (HOSPITAL_BASED_OUTPATIENT_CLINIC_OR_DEPARTMENT_OTHER): Payer: Self-pay

## 2023-09-28 ENCOUNTER — Other Ambulatory Visit: Payer: Self-pay

## 2023-09-28 ENCOUNTER — Other Ambulatory Visit (HOSPITAL_BASED_OUTPATIENT_CLINIC_OR_DEPARTMENT_OTHER): Payer: Self-pay

## 2023-09-29 ENCOUNTER — Other Ambulatory Visit: Payer: Self-pay

## 2023-09-29 ENCOUNTER — Other Ambulatory Visit (HOSPITAL_BASED_OUTPATIENT_CLINIC_OR_DEPARTMENT_OTHER): Payer: Self-pay

## 2023-09-29 ENCOUNTER — Other Ambulatory Visit (HOSPITAL_COMMUNITY): Payer: Self-pay

## 2023-10-02 ENCOUNTER — Other Ambulatory Visit (HOSPITAL_BASED_OUTPATIENT_CLINIC_OR_DEPARTMENT_OTHER): Payer: Self-pay

## 2023-10-02 DIAGNOSIS — M791 Myalgia, unspecified site: Secondary | ICD-10-CM | POA: Diagnosis not present

## 2023-10-02 DIAGNOSIS — J101 Influenza due to other identified influenza virus with other respiratory manifestations: Secondary | ICD-10-CM | POA: Diagnosis not present

## 2023-10-02 DIAGNOSIS — Z299 Encounter for prophylactic measures, unspecified: Secondary | ICD-10-CM | POA: Diagnosis not present

## 2023-10-03 ENCOUNTER — Other Ambulatory Visit (HOSPITAL_BASED_OUTPATIENT_CLINIC_OR_DEPARTMENT_OTHER): Payer: Self-pay

## 2023-10-03 MED ORDER — MOMETASONE FURO-FORMOTEROL FUM 200-5 MCG/ACT IN AERO
2.0000 | INHALATION_SPRAY | Freq: Two times a day (BID) | RESPIRATORY_TRACT | 3 refills | Status: AC
  Filled 2023-10-03: qty 13, 30d supply, fill #0
  Filled 2024-01-18: qty 13, 30d supply, fill #1
  Filled 2024-02-13: qty 13, 30d supply, fill #2
  Filled 2024-03-19: qty 13, 30d supply, fill #0

## 2023-10-04 ENCOUNTER — Other Ambulatory Visit (HOSPITAL_BASED_OUTPATIENT_CLINIC_OR_DEPARTMENT_OTHER): Payer: Self-pay

## 2023-10-05 DIAGNOSIS — R5383 Other fatigue: Secondary | ICD-10-CM | POA: Diagnosis not present

## 2023-10-05 DIAGNOSIS — R590 Localized enlarged lymph nodes: Secondary | ICD-10-CM | POA: Diagnosis not present

## 2023-10-05 DIAGNOSIS — Z6835 Body mass index (BMI) 35.0-35.9, adult: Secondary | ICD-10-CM | POA: Diagnosis not present

## 2023-10-05 DIAGNOSIS — Z299 Encounter for prophylactic measures, unspecified: Secondary | ICD-10-CM | POA: Diagnosis not present

## 2023-10-07 ENCOUNTER — Other Ambulatory Visit (HOSPITAL_BASED_OUTPATIENT_CLINIC_OR_DEPARTMENT_OTHER): Payer: Self-pay

## 2023-10-11 DIAGNOSIS — H6690 Otitis media, unspecified, unspecified ear: Secondary | ICD-10-CM | POA: Diagnosis not present

## 2023-10-11 DIAGNOSIS — I10 Essential (primary) hypertension: Secondary | ICD-10-CM | POA: Diagnosis not present

## 2023-10-11 DIAGNOSIS — Z299 Encounter for prophylactic measures, unspecified: Secondary | ICD-10-CM | POA: Diagnosis not present

## 2023-10-11 DIAGNOSIS — E1169 Type 2 diabetes mellitus with other specified complication: Secondary | ICD-10-CM | POA: Diagnosis not present

## 2023-10-11 DIAGNOSIS — R059 Cough, unspecified: Secondary | ICD-10-CM | POA: Diagnosis not present

## 2023-10-11 DIAGNOSIS — J45909 Unspecified asthma, uncomplicated: Secondary | ICD-10-CM | POA: Diagnosis not present

## 2023-10-12 ENCOUNTER — Other Ambulatory Visit: Payer: Self-pay

## 2023-10-12 ENCOUNTER — Other Ambulatory Visit (HOSPITAL_BASED_OUTPATIENT_CLINIC_OR_DEPARTMENT_OTHER): Payer: Self-pay

## 2023-10-14 ENCOUNTER — Other Ambulatory Visit (HOSPITAL_BASED_OUTPATIENT_CLINIC_OR_DEPARTMENT_OTHER): Payer: Self-pay

## 2023-10-14 MED ORDER — AIRSUPRA 90-80 MCG/ACT IN AERO
2.0000 | INHALATION_SPRAY | Freq: Four times a day (QID) | RESPIRATORY_TRACT | 3 refills | Status: AC
  Filled 2023-10-14: qty 10.7, 30d supply, fill #0
  Filled 2023-11-14: qty 10.7, 30d supply, fill #1
  Filled 2024-01-02: qty 10.7, 30d supply, fill #2
  Filled 2024-01-27: qty 10.7, 30d supply, fill #3

## 2023-10-23 ENCOUNTER — Other Ambulatory Visit (HOSPITAL_BASED_OUTPATIENT_CLINIC_OR_DEPARTMENT_OTHER): Payer: Self-pay

## 2023-10-23 ENCOUNTER — Other Ambulatory Visit: Payer: Self-pay

## 2023-10-24 ENCOUNTER — Other Ambulatory Visit (HOSPITAL_BASED_OUTPATIENT_CLINIC_OR_DEPARTMENT_OTHER): Payer: Self-pay

## 2023-10-24 DIAGNOSIS — R197 Diarrhea, unspecified: Secondary | ICD-10-CM | POA: Diagnosis not present

## 2023-10-24 DIAGNOSIS — T50905A Adverse effect of unspecified drugs, medicaments and biological substances, initial encounter: Secondary | ICD-10-CM | POA: Diagnosis not present

## 2023-10-24 DIAGNOSIS — Z6832 Body mass index (BMI) 32.0-32.9, adult: Secondary | ICD-10-CM | POA: Diagnosis not present

## 2023-10-24 DIAGNOSIS — I10 Essential (primary) hypertension: Secondary | ICD-10-CM | POA: Diagnosis not present

## 2023-10-24 DIAGNOSIS — R748 Abnormal levels of other serum enzymes: Secondary | ICD-10-CM | POA: Diagnosis not present

## 2023-10-24 DIAGNOSIS — Z299 Encounter for prophylactic measures, unspecified: Secondary | ICD-10-CM | POA: Diagnosis not present

## 2023-10-24 MED ORDER — MOUNJARO 5 MG/0.5ML ~~LOC~~ SOAJ
5.0000 mg | SUBCUTANEOUS | 2 refills | Status: AC
  Filled 2023-10-24: qty 2, 28d supply, fill #0

## 2023-10-25 DIAGNOSIS — R197 Diarrhea, unspecified: Secondary | ICD-10-CM | POA: Diagnosis not present

## 2023-10-27 ENCOUNTER — Other Ambulatory Visit (HOSPITAL_BASED_OUTPATIENT_CLINIC_OR_DEPARTMENT_OTHER): Payer: Self-pay

## 2023-10-27 ENCOUNTER — Other Ambulatory Visit: Payer: Self-pay

## 2023-10-30 ENCOUNTER — Other Ambulatory Visit (HOSPITAL_BASED_OUTPATIENT_CLINIC_OR_DEPARTMENT_OTHER): Payer: Self-pay

## 2023-10-30 DIAGNOSIS — E1169 Type 2 diabetes mellitus with other specified complication: Secondary | ICD-10-CM | POA: Diagnosis not present

## 2023-10-30 DIAGNOSIS — I10 Essential (primary) hypertension: Secondary | ICD-10-CM | POA: Diagnosis not present

## 2023-10-30 DIAGNOSIS — Z299 Encounter for prophylactic measures, unspecified: Secondary | ICD-10-CM | POA: Diagnosis not present

## 2023-10-30 MED ORDER — MOUNJARO 7.5 MG/0.5ML ~~LOC~~ SOAJ
7.5000 mg | SUBCUTANEOUS | 0 refills | Status: DC
  Filled 2023-10-30 – 2023-11-20 (×4): qty 2, 28d supply, fill #0

## 2023-11-01 ENCOUNTER — Other Ambulatory Visit (HOSPITAL_BASED_OUTPATIENT_CLINIC_OR_DEPARTMENT_OTHER): Payer: Self-pay

## 2023-11-14 ENCOUNTER — Other Ambulatory Visit (HOSPITAL_BASED_OUTPATIENT_CLINIC_OR_DEPARTMENT_OTHER): Payer: Self-pay

## 2023-11-14 ENCOUNTER — Other Ambulatory Visit: Payer: Self-pay

## 2023-11-20 ENCOUNTER — Other Ambulatory Visit (HOSPITAL_BASED_OUTPATIENT_CLINIC_OR_DEPARTMENT_OTHER): Payer: Self-pay

## 2023-11-20 DIAGNOSIS — J45901 Unspecified asthma with (acute) exacerbation: Secondary | ICD-10-CM | POA: Diagnosis not present

## 2023-11-20 DIAGNOSIS — I10 Essential (primary) hypertension: Secondary | ICD-10-CM | POA: Diagnosis not present

## 2023-11-20 DIAGNOSIS — Z299 Encounter for prophylactic measures, unspecified: Secondary | ICD-10-CM | POA: Diagnosis not present

## 2023-11-22 ENCOUNTER — Other Ambulatory Visit (HOSPITAL_BASED_OUTPATIENT_CLINIC_OR_DEPARTMENT_OTHER): Payer: Self-pay

## 2023-12-06 ENCOUNTER — Other Ambulatory Visit: Payer: Self-pay

## 2023-12-06 ENCOUNTER — Other Ambulatory Visit (HOSPITAL_BASED_OUTPATIENT_CLINIC_OR_DEPARTMENT_OTHER): Payer: Self-pay

## 2023-12-06 MED ORDER — MOUNJARO 7.5 MG/0.5ML ~~LOC~~ SOAJ
7.5000 mg | SUBCUTANEOUS | 0 refills | Status: AC
  Filled 2023-12-06 – 2023-12-13 (×2): qty 2, 28d supply, fill #0

## 2023-12-08 ENCOUNTER — Other Ambulatory Visit (HOSPITAL_BASED_OUTPATIENT_CLINIC_OR_DEPARTMENT_OTHER): Payer: Self-pay

## 2023-12-13 ENCOUNTER — Other Ambulatory Visit (HOSPITAL_BASED_OUTPATIENT_CLINIC_OR_DEPARTMENT_OTHER): Payer: Self-pay

## 2023-12-13 ENCOUNTER — Other Ambulatory Visit: Payer: Self-pay

## 2023-12-25 ENCOUNTER — Institutional Professional Consult (permissible substitution) (INDEPENDENT_AMBULATORY_CARE_PROVIDER_SITE_OTHER): Admitting: Otolaryngology

## 2024-01-02 ENCOUNTER — Other Ambulatory Visit (HOSPITAL_BASED_OUTPATIENT_CLINIC_OR_DEPARTMENT_OTHER): Payer: Self-pay

## 2024-01-11 ENCOUNTER — Telehealth: Admitting: Nurse Practitioner

## 2024-01-11 DIAGNOSIS — R399 Unspecified symptoms and signs involving the genitourinary system: Secondary | ICD-10-CM | POA: Diagnosis not present

## 2024-01-11 MED ORDER — SULFAMETHOXAZOLE-TRIMETHOPRIM 800-160 MG PO TABS: 1.0000 | ORAL_TABLET | Freq: Two times a day (BID) | ORAL | 0 refills | Status: AC

## 2024-01-11 NOTE — Progress Notes (Signed)
 I have spent 5 minutes in review of e-visit questionnaire, review and updating patient chart, medical decision making and response to patient.   Claiborne Rigg, NP

## 2024-01-11 NOTE — [facility] Notes (Signed)

## 2024-01-12 ENCOUNTER — Other Ambulatory Visit (HOSPITAL_COMMUNITY)

## 2024-01-12 DIAGNOSIS — E119 Type 2 diabetes mellitus without complications: Secondary | ICD-10-CM | POA: Diagnosis not present

## 2024-01-12 DIAGNOSIS — S8990XA Unspecified injury of unspecified lower leg, initial encounter: Secondary | ICD-10-CM | POA: Diagnosis not present

## 2024-01-12 DIAGNOSIS — R52 Pain, unspecified: Secondary | ICD-10-CM | POA: Diagnosis not present

## 2024-01-12 DIAGNOSIS — Z299 Encounter for prophylactic measures, unspecified: Secondary | ICD-10-CM | POA: Diagnosis not present

## 2024-01-16 ENCOUNTER — Other Ambulatory Visit (HOSPITAL_BASED_OUTPATIENT_CLINIC_OR_DEPARTMENT_OTHER): Payer: Self-pay

## 2024-01-16 DIAGNOSIS — Z299 Encounter for prophylactic measures, unspecified: Secondary | ICD-10-CM | POA: Diagnosis not present

## 2024-01-16 DIAGNOSIS — M25511 Pain in right shoulder: Secondary | ICD-10-CM | POA: Diagnosis not present

## 2024-01-16 DIAGNOSIS — I10 Essential (primary) hypertension: Secondary | ICD-10-CM | POA: Diagnosis not present

## 2024-01-16 DIAGNOSIS — Z681 Body mass index (BMI) 19 or less, adult: Secondary | ICD-10-CM | POA: Diagnosis not present

## 2024-01-18 ENCOUNTER — Other Ambulatory Visit (HOSPITAL_BASED_OUTPATIENT_CLINIC_OR_DEPARTMENT_OTHER): Payer: Self-pay

## 2024-01-26 DIAGNOSIS — M531 Cervicobrachial syndrome: Secondary | ICD-10-CM | POA: Diagnosis not present

## 2024-01-26 DIAGNOSIS — M4004 Postural kyphosis, thoracic region: Secondary | ICD-10-CM | POA: Diagnosis not present

## 2024-01-26 DIAGNOSIS — S134XXA Sprain of ligaments of cervical spine, initial encounter: Secondary | ICD-10-CM | POA: Diagnosis not present

## 2024-01-27 ENCOUNTER — Other Ambulatory Visit (HOSPITAL_BASED_OUTPATIENT_CLINIC_OR_DEPARTMENT_OTHER): Payer: Self-pay

## 2024-02-02 ENCOUNTER — Other Ambulatory Visit (HOSPITAL_BASED_OUTPATIENT_CLINIC_OR_DEPARTMENT_OTHER): Payer: Self-pay

## 2024-02-06 ENCOUNTER — Other Ambulatory Visit (HOSPITAL_BASED_OUTPATIENT_CLINIC_OR_DEPARTMENT_OTHER): Payer: Self-pay

## 2024-02-06 MED ORDER — PREDNISONE 5 MG (21) PO TBPK
ORAL_TABLET | ORAL | 0 refills | Status: AC
  Filled 2024-02-06: qty 21, 6d supply, fill #0

## 2024-02-13 ENCOUNTER — Other Ambulatory Visit (HOSPITAL_BASED_OUTPATIENT_CLINIC_OR_DEPARTMENT_OTHER): Payer: Self-pay

## 2024-02-14 ENCOUNTER — Other Ambulatory Visit (HOSPITAL_BASED_OUTPATIENT_CLINIC_OR_DEPARTMENT_OTHER): Payer: Self-pay

## 2024-03-01 ENCOUNTER — Other Ambulatory Visit (HOSPITAL_BASED_OUTPATIENT_CLINIC_OR_DEPARTMENT_OTHER): Payer: Self-pay

## 2024-03-08 ENCOUNTER — Other Ambulatory Visit (HOSPITAL_BASED_OUTPATIENT_CLINIC_OR_DEPARTMENT_OTHER): Payer: Self-pay

## 2024-03-08 MED ORDER — MOUNJARO 10 MG/0.5ML ~~LOC~~ SOAJ
10.0000 mg | SUBCUTANEOUS | 2 refills | Status: AC
  Filled 2024-03-08 – 2024-08-29 (×3): qty 2, 28d supply, fill #0

## 2024-03-13 ENCOUNTER — Other Ambulatory Visit (HOSPITAL_BASED_OUTPATIENT_CLINIC_OR_DEPARTMENT_OTHER): Payer: Self-pay

## 2024-03-15 ENCOUNTER — Other Ambulatory Visit (HOSPITAL_BASED_OUTPATIENT_CLINIC_OR_DEPARTMENT_OTHER): Payer: Self-pay

## 2024-03-15 MED ORDER — MOUNJARO 10 MG/0.5ML ~~LOC~~ SOAJ
10.0000 mg | SUBCUTANEOUS | 2 refills | Status: AC
  Filled 2024-03-15 – 2024-04-23 (×3): qty 2, 28d supply, fill #0
  Filled 2024-05-16 – 2024-08-29 (×3): qty 2, 28d supply, fill #1

## 2024-03-16 ENCOUNTER — Other Ambulatory Visit (HOSPITAL_BASED_OUTPATIENT_CLINIC_OR_DEPARTMENT_OTHER): Payer: Self-pay

## 2024-03-19 ENCOUNTER — Other Ambulatory Visit (HOSPITAL_BASED_OUTPATIENT_CLINIC_OR_DEPARTMENT_OTHER): Payer: Self-pay

## 2024-03-20 ENCOUNTER — Other Ambulatory Visit (HOSPITAL_BASED_OUTPATIENT_CLINIC_OR_DEPARTMENT_OTHER): Payer: Self-pay

## 2024-03-21 ENCOUNTER — Other Ambulatory Visit (HOSPITAL_BASED_OUTPATIENT_CLINIC_OR_DEPARTMENT_OTHER): Payer: Self-pay

## 2024-03-22 ENCOUNTER — Other Ambulatory Visit (HOSPITAL_BASED_OUTPATIENT_CLINIC_OR_DEPARTMENT_OTHER): Payer: Self-pay

## 2024-03-25 ENCOUNTER — Other Ambulatory Visit (HOSPITAL_BASED_OUTPATIENT_CLINIC_OR_DEPARTMENT_OTHER): Payer: Self-pay

## 2024-03-27 ENCOUNTER — Other Ambulatory Visit (HOSPITAL_BASED_OUTPATIENT_CLINIC_OR_DEPARTMENT_OTHER): Payer: Self-pay

## 2024-03-28 ENCOUNTER — Other Ambulatory Visit (HOSPITAL_BASED_OUTPATIENT_CLINIC_OR_DEPARTMENT_OTHER): Payer: Self-pay

## 2024-03-29 ENCOUNTER — Other Ambulatory Visit (HOSPITAL_BASED_OUTPATIENT_CLINIC_OR_DEPARTMENT_OTHER): Payer: Self-pay

## 2024-04-01 ENCOUNTER — Other Ambulatory Visit (HOSPITAL_BASED_OUTPATIENT_CLINIC_OR_DEPARTMENT_OTHER): Payer: Self-pay

## 2024-04-02 ENCOUNTER — Other Ambulatory Visit (HOSPITAL_BASED_OUTPATIENT_CLINIC_OR_DEPARTMENT_OTHER): Payer: Self-pay

## 2024-04-02 MED ORDER — ALBUTEROL SULFATE HFA 108 (90 BASE) MCG/ACT IN AERS
1.0000 | INHALATION_SPRAY | Freq: Four times a day (QID) | RESPIRATORY_TRACT | 3 refills | Status: AC
  Filled 2024-04-02 – 2024-04-11 (×2): qty 6.7, 30d supply, fill #0
  Filled 2024-04-23 – 2024-06-26 (×2): qty 20.1, 90d supply, fill #0

## 2024-04-10 ENCOUNTER — Other Ambulatory Visit (HOSPITAL_BASED_OUTPATIENT_CLINIC_OR_DEPARTMENT_OTHER): Payer: Self-pay

## 2024-04-11 ENCOUNTER — Other Ambulatory Visit (HOSPITAL_BASED_OUTPATIENT_CLINIC_OR_DEPARTMENT_OTHER): Payer: Self-pay

## 2024-04-23 ENCOUNTER — Other Ambulatory Visit (HOSPITAL_BASED_OUTPATIENT_CLINIC_OR_DEPARTMENT_OTHER): Payer: Self-pay

## 2024-04-30 ENCOUNTER — Other Ambulatory Visit (HOSPITAL_BASED_OUTPATIENT_CLINIC_OR_DEPARTMENT_OTHER): Payer: Self-pay

## 2024-04-30 MED ORDER — BUDESONIDE-FORMOTEROL FUMARATE 160-4.5 MCG/ACT IN AERO
2.0000 | INHALATION_SPRAY | Freq: Two times a day (BID) | RESPIRATORY_TRACT | 3 refills | Status: AC
  Filled 2024-04-30: qty 10.2, 30d supply, fill #0
  Filled 2024-06-26: qty 10.2, 30d supply, fill #1
  Filled 2024-08-29: qty 10.2, 30d supply, fill #2

## 2024-04-30 MED ORDER — MEASLES, MUMPS & RUBELLA VAC IJ SOLR
INTRAMUSCULAR | 0 refills | Status: AC
  Filled 2024-04-30: qty 1, 30d supply, fill #0

## 2024-04-30 MED ORDER — PREVNAR 20 0.5 ML IM SUSY
PREFILLED_SYRINGE | INTRAMUSCULAR | 0 refills | Status: AC
  Filled 2024-04-30: qty 0.5, 30d supply, fill #0

## 2024-04-30 MED ORDER — AMOXICILLIN-POT CLAVULANATE 500-125 MG PO TABS
1.0000 | ORAL_TABLET | Freq: Two times a day (BID) | ORAL | 0 refills | Status: AC
  Filled 2024-04-30: qty 14, 7d supply, fill #0

## 2024-04-30 MED ORDER — PANTOPRAZOLE SODIUM 40 MG PO TBEC
40.0000 mg | DELAYED_RELEASE_TABLET | Freq: Every day | ORAL | 1 refills | Status: AC
  Filled 2024-04-30: qty 30, 30d supply, fill #0
  Filled 2024-08-29: qty 30, 30d supply, fill #1

## 2024-04-30 MED ORDER — MOMETASONE FUROATE 50 MCG/ACT NA SUSP
2.0000 | Freq: Two times a day (BID) | NASAL | 2 refills | Status: AC
  Filled 2024-04-30: qty 17, 30d supply, fill #0
  Filled 2024-08-29: qty 17, 30d supply, fill #1

## 2024-04-30 MED ORDER — ALBUTEROL SULFATE (2.5 MG/3ML) 0.083% IN NEBU
2.5000 mg | INHALATION_SOLUTION | Freq: Four times a day (QID) | RESPIRATORY_TRACT | 1 refills | Status: AC | PRN
  Filled 2024-04-30: qty 75, 7d supply, fill #0
  Filled 2024-05-20: qty 75, 7d supply, fill #1

## 2024-05-03 ENCOUNTER — Other Ambulatory Visit (HOSPITAL_BASED_OUTPATIENT_CLINIC_OR_DEPARTMENT_OTHER): Payer: Self-pay

## 2024-05-16 ENCOUNTER — Other Ambulatory Visit (HOSPITAL_BASED_OUTPATIENT_CLINIC_OR_DEPARTMENT_OTHER): Payer: Self-pay

## 2024-05-20 ENCOUNTER — Other Ambulatory Visit (HOSPITAL_BASED_OUTPATIENT_CLINIC_OR_DEPARTMENT_OTHER): Payer: Self-pay

## 2024-05-31 ENCOUNTER — Other Ambulatory Visit (HOSPITAL_BASED_OUTPATIENT_CLINIC_OR_DEPARTMENT_OTHER): Payer: Self-pay

## 2024-05-31 MED ORDER — TYRVAYA 0.03 MG/ACT NA SOLN
1.0000 | Freq: Two times a day (BID) | NASAL | 12 refills | Status: AC
  Filled 2024-05-31: qty 8.4, 30d supply, fill #0

## 2024-05-31 MED ORDER — FLUZONE 0.5 ML IM SUSY
0.5000 mL | PREFILLED_SYRINGE | Freq: Once | INTRAMUSCULAR | 0 refills | Status: AC
  Filled 2024-05-31 – 2024-06-13 (×3): qty 0.5, 1d supply, fill #0

## 2024-06-03 ENCOUNTER — Other Ambulatory Visit (HOSPITAL_BASED_OUTPATIENT_CLINIC_OR_DEPARTMENT_OTHER): Payer: Self-pay

## 2024-06-04 ENCOUNTER — Other Ambulatory Visit (HOSPITAL_BASED_OUTPATIENT_CLINIC_OR_DEPARTMENT_OTHER): Payer: Self-pay

## 2024-06-07 ENCOUNTER — Other Ambulatory Visit (HOSPITAL_BASED_OUTPATIENT_CLINIC_OR_DEPARTMENT_OTHER): Payer: Self-pay

## 2024-06-10 ENCOUNTER — Other Ambulatory Visit (HOSPITAL_BASED_OUTPATIENT_CLINIC_OR_DEPARTMENT_OTHER): Payer: Self-pay

## 2024-06-11 ENCOUNTER — Other Ambulatory Visit (HOSPITAL_BASED_OUTPATIENT_CLINIC_OR_DEPARTMENT_OTHER): Payer: Self-pay

## 2024-06-12 ENCOUNTER — Other Ambulatory Visit (HOSPITAL_BASED_OUTPATIENT_CLINIC_OR_DEPARTMENT_OTHER): Payer: Self-pay

## 2024-06-13 ENCOUNTER — Other Ambulatory Visit (HOSPITAL_BASED_OUTPATIENT_CLINIC_OR_DEPARTMENT_OTHER): Payer: Self-pay

## 2024-06-14 ENCOUNTER — Other Ambulatory Visit (HOSPITAL_BASED_OUTPATIENT_CLINIC_OR_DEPARTMENT_OTHER): Payer: Self-pay

## 2024-06-17 ENCOUNTER — Other Ambulatory Visit (HOSPITAL_BASED_OUTPATIENT_CLINIC_OR_DEPARTMENT_OTHER): Payer: Self-pay

## 2024-06-18 ENCOUNTER — Other Ambulatory Visit: Payer: Self-pay | Admitting: Medical Genetics

## 2024-06-18 DIAGNOSIS — Z006 Encounter for examination for normal comparison and control in clinical research program: Secondary | ICD-10-CM

## 2024-06-19 ENCOUNTER — Other Ambulatory Visit (HOSPITAL_BASED_OUTPATIENT_CLINIC_OR_DEPARTMENT_OTHER): Payer: Self-pay

## 2024-06-26 ENCOUNTER — Other Ambulatory Visit (HOSPITAL_BASED_OUTPATIENT_CLINIC_OR_DEPARTMENT_OTHER): Payer: Self-pay

## 2024-06-26 MED ORDER — FLUZONE 0.5 ML IM SUSY
0.5000 mL | PREFILLED_SYRINGE | Freq: Once | INTRAMUSCULAR | 0 refills | Status: AC
  Filled 2024-06-26: qty 0.5, 1d supply, fill #0

## 2024-06-27 ENCOUNTER — Other Ambulatory Visit (HOSPITAL_BASED_OUTPATIENT_CLINIC_OR_DEPARTMENT_OTHER): Payer: Self-pay

## 2024-06-27 MED ORDER — BOOSTRIX 5-2.5-18.5 LF-MCG/0.5 IM SUSY
PREFILLED_SYRINGE | INTRAMUSCULAR | 0 refills | Status: AC
  Filled 2024-06-27: qty 0.5, 30d supply, fill #0
  Filled 2024-06-27: qty 0.5, 1d supply, fill #0

## 2024-06-28 ENCOUNTER — Other Ambulatory Visit (HOSPITAL_BASED_OUTPATIENT_CLINIC_OR_DEPARTMENT_OTHER): Payer: Self-pay

## 2024-07-01 ENCOUNTER — Other Ambulatory Visit (HOSPITAL_BASED_OUTPATIENT_CLINIC_OR_DEPARTMENT_OTHER): Payer: Self-pay

## 2024-07-02 ENCOUNTER — Other Ambulatory Visit (HOSPITAL_BASED_OUTPATIENT_CLINIC_OR_DEPARTMENT_OTHER): Payer: Self-pay

## 2024-07-03 ENCOUNTER — Other Ambulatory Visit (HOSPITAL_BASED_OUTPATIENT_CLINIC_OR_DEPARTMENT_OTHER): Payer: Self-pay

## 2024-07-04 ENCOUNTER — Other Ambulatory Visit (HOSPITAL_BASED_OUTPATIENT_CLINIC_OR_DEPARTMENT_OTHER): Payer: Self-pay

## 2024-07-05 ENCOUNTER — Other Ambulatory Visit (HOSPITAL_BASED_OUTPATIENT_CLINIC_OR_DEPARTMENT_OTHER): Payer: Self-pay

## 2024-07-08 ENCOUNTER — Other Ambulatory Visit (HOSPITAL_BASED_OUTPATIENT_CLINIC_OR_DEPARTMENT_OTHER): Payer: Self-pay

## 2024-07-10 ENCOUNTER — Other Ambulatory Visit (HOSPITAL_BASED_OUTPATIENT_CLINIC_OR_DEPARTMENT_OTHER): Payer: Self-pay

## 2024-07-12 ENCOUNTER — Other Ambulatory Visit (HOSPITAL_BASED_OUTPATIENT_CLINIC_OR_DEPARTMENT_OTHER): Payer: Self-pay

## 2024-07-15 ENCOUNTER — Other Ambulatory Visit (HOSPITAL_BASED_OUTPATIENT_CLINIC_OR_DEPARTMENT_OTHER): Payer: Self-pay

## 2024-07-17 ENCOUNTER — Other Ambulatory Visit (HOSPITAL_BASED_OUTPATIENT_CLINIC_OR_DEPARTMENT_OTHER): Payer: Self-pay

## 2024-07-19 ENCOUNTER — Other Ambulatory Visit (HOSPITAL_BASED_OUTPATIENT_CLINIC_OR_DEPARTMENT_OTHER): Payer: Self-pay

## 2024-07-20 ENCOUNTER — Other Ambulatory Visit (HOSPITAL_BASED_OUTPATIENT_CLINIC_OR_DEPARTMENT_OTHER): Payer: Self-pay

## 2024-07-22 ENCOUNTER — Other Ambulatory Visit (HOSPITAL_BASED_OUTPATIENT_CLINIC_OR_DEPARTMENT_OTHER): Payer: Self-pay

## 2024-07-23 ENCOUNTER — Other Ambulatory Visit (HOSPITAL_BASED_OUTPATIENT_CLINIC_OR_DEPARTMENT_OTHER): Payer: Self-pay

## 2024-07-24 ENCOUNTER — Other Ambulatory Visit (HOSPITAL_BASED_OUTPATIENT_CLINIC_OR_DEPARTMENT_OTHER): Payer: Self-pay

## 2024-07-27 ENCOUNTER — Other Ambulatory Visit (HOSPITAL_BASED_OUTPATIENT_CLINIC_OR_DEPARTMENT_OTHER): Payer: Self-pay

## 2024-07-29 ENCOUNTER — Other Ambulatory Visit (HOSPITAL_BASED_OUTPATIENT_CLINIC_OR_DEPARTMENT_OTHER): Payer: Self-pay

## 2024-07-30 ENCOUNTER — Other Ambulatory Visit (HOSPITAL_BASED_OUTPATIENT_CLINIC_OR_DEPARTMENT_OTHER): Payer: Self-pay

## 2024-07-31 ENCOUNTER — Other Ambulatory Visit (HOSPITAL_BASED_OUTPATIENT_CLINIC_OR_DEPARTMENT_OTHER): Payer: Self-pay

## 2024-08-01 ENCOUNTER — Other Ambulatory Visit (HOSPITAL_BASED_OUTPATIENT_CLINIC_OR_DEPARTMENT_OTHER): Payer: Self-pay

## 2024-08-02 ENCOUNTER — Other Ambulatory Visit (HOSPITAL_BASED_OUTPATIENT_CLINIC_OR_DEPARTMENT_OTHER): Payer: Self-pay

## 2024-08-29 ENCOUNTER — Other Ambulatory Visit (HOSPITAL_BASED_OUTPATIENT_CLINIC_OR_DEPARTMENT_OTHER): Payer: Self-pay

## 2024-08-30 ENCOUNTER — Other Ambulatory Visit (HOSPITAL_BASED_OUTPATIENT_CLINIC_OR_DEPARTMENT_OTHER): Payer: Self-pay

## 2024-08-31 ENCOUNTER — Other Ambulatory Visit (HOSPITAL_BASED_OUTPATIENT_CLINIC_OR_DEPARTMENT_OTHER): Payer: Self-pay

## 2024-09-02 ENCOUNTER — Other Ambulatory Visit (HOSPITAL_BASED_OUTPATIENT_CLINIC_OR_DEPARTMENT_OTHER): Payer: Self-pay

## 2024-09-03 ENCOUNTER — Other Ambulatory Visit (HOSPITAL_BASED_OUTPATIENT_CLINIC_OR_DEPARTMENT_OTHER): Payer: Self-pay

## 2024-09-04 ENCOUNTER — Other Ambulatory Visit (HOSPITAL_BASED_OUTPATIENT_CLINIC_OR_DEPARTMENT_OTHER): Payer: Self-pay

## 2024-09-05 ENCOUNTER — Other Ambulatory Visit (HOSPITAL_BASED_OUTPATIENT_CLINIC_OR_DEPARTMENT_OTHER): Payer: Self-pay

## 2024-09-06 ENCOUNTER — Other Ambulatory Visit (HOSPITAL_BASED_OUTPATIENT_CLINIC_OR_DEPARTMENT_OTHER): Payer: Self-pay

## 2024-09-09 ENCOUNTER — Other Ambulatory Visit (HOSPITAL_BASED_OUTPATIENT_CLINIC_OR_DEPARTMENT_OTHER): Payer: Self-pay

## 2024-09-10 ENCOUNTER — Other Ambulatory Visit (HOSPITAL_BASED_OUTPATIENT_CLINIC_OR_DEPARTMENT_OTHER): Payer: Self-pay

## 2024-09-11 ENCOUNTER — Other Ambulatory Visit (HOSPITAL_BASED_OUTPATIENT_CLINIC_OR_DEPARTMENT_OTHER): Payer: Self-pay

## 2024-09-16 ENCOUNTER — Other Ambulatory Visit (HOSPITAL_BASED_OUTPATIENT_CLINIC_OR_DEPARTMENT_OTHER): Payer: Self-pay

## 2024-09-17 ENCOUNTER — Other Ambulatory Visit (HOSPITAL_BASED_OUTPATIENT_CLINIC_OR_DEPARTMENT_OTHER): Payer: Self-pay

## 2024-09-18 ENCOUNTER — Other Ambulatory Visit (HOSPITAL_BASED_OUTPATIENT_CLINIC_OR_DEPARTMENT_OTHER): Payer: Self-pay

## 2024-09-19 ENCOUNTER — Other Ambulatory Visit (HOSPITAL_BASED_OUTPATIENT_CLINIC_OR_DEPARTMENT_OTHER): Payer: Self-pay
